# Patient Record
Sex: Female | Born: 1995 | Race: White | Hispanic: No | Marital: Married
Health system: Midwestern US, Community
[De-identification: ages and names within clinical notes are randomized; demographics above are authoritative.]

## PROBLEM LIST (undated history)

## (undated) DIAGNOSIS — E782 Mixed hyperlipidemia: Secondary | ICD-10-CM

## (undated) DIAGNOSIS — F419 Anxiety disorder, unspecified: Secondary | ICD-10-CM

## (undated) DIAGNOSIS — F329 Major depressive disorder, single episode, unspecified: Secondary | ICD-10-CM

## (undated) DIAGNOSIS — F32A Depression, unspecified: Secondary | ICD-10-CM

## (undated) HISTORY — DX: Major depressive disorder, single episode, unspecified: F32.9

## (undated) HISTORY — DX: Anxiety disorder, unspecified: F41.9

## (undated) HISTORY — DX: Depression, unspecified: F32.A

---

## 2017-08-05 ENCOUNTER — Encounter: Payer: Self-pay | Admitting: Osteopathic Medicine

## 2017-08-05 ENCOUNTER — Ambulatory Visit (INDEPENDENT_AMBULATORY_CARE_PROVIDER_SITE_OTHER): Payer: Managed Care, Other (non HMO) | Admitting: Osteopathic Medicine

## 2017-08-05 VITALS — BP 107/68 | HR 67 | Temp 98.3°F | Resp 16 | Wt 110.5 lb

## 2017-08-05 DIAGNOSIS — Z975 Presence of (intrauterine) contraceptive device: Secondary | ICD-10-CM | POA: Diagnosis not present

## 2017-08-05 DIAGNOSIS — Z8659 Personal history of other mental and behavioral disorders: Secondary | ICD-10-CM | POA: Diagnosis not present

## 2017-08-05 MED ORDER — LEVONORGESTREL 20 MCG/24HR IU IUD: 1.0000 | INTRAUTERINE_SYSTEM | Freq: Once | INTRAUTERINE | 0 refills | Status: DC

## 2017-08-05 MED ORDER — FLUOXETINE HCL 10 MG PO CAPS: 10.0000 mg | ORAL_CAPSULE | Freq: Every day | ORAL | 3 refills | Status: DC

## 2017-08-05 NOTE — Progress Notes (Signed)
HPI: Madison Carpenter is a 21 y.o. female who  has a past medical history of Anxiety and Depression.  she presents to [facility] today, 08/05/17,  for chief complaint of:  Chief Complaint  Patient presents with  . New Patient (Initial Visit)    Establish care    A pleasant new patient here to establish care. Her only real issue is that she needs a refill on her Prozac. She states that she has been well on this medication for several years, taking a low dose. She would like to get off of this at some point in the next couple of years as she plans on possibly trying to get pregnant in the next few years. Currently has IUD in place   Past medical, surgical, social and family history reviewed:  There are no active problems to display for this patient.  No past surgical history on file.  Social History   Tobacco Use  . Smoking status: Never Smoker  . Smokeless tobacco: Never Used  Substance Use Topics  . Alcohol use: Yes    Comment: socially    No family history on file.   Current medication list and allergy/intolerance information reviewed:    Current Outpatient Medications  Medication Sig Dispense Refill  . FLUoxetine (PROZAC) 10 MG capsule Take 1 capsule (10 mg total) daily by mouth. 90 capsule 3  . levonorgestrel (MIRENA) 20 MCG/24HR IUD 1 Intra Uterine Device (1 each total) once for 1 dose by Intrauterine route. Placed 2014 approx 1 each 0   No current facility-administered medications for this visit.     No Known Allergies    Review of Systems:  Constitutional:  No  fever, no chills, No recent illness, No unintentional weight changes. No significant fatigue.   HEENT: No  headache, no vision change, no hearing change, No sore throat, No  sinus pressure  Cardiac: No  chest pain, No  pressure, No palpitations, No  Orthopnea  Respiratory:  No  shortness of breath. No  Cough  Gastrointestinal: No  abdominal pain, No  nausea, No   vomiting,  No  blood in stool, No  diarrhea, No  constipation   Musculoskeletal: No new myalgia/arthralgia  Genitourinary: No  incontinence, No  abnormal genital bleeding, No abnormal genital discharge  Skin: No  Rash, No other wounds/concerning lesions  Hem/Onc: No  easy bruising/bleeding, No  abnormal lymph node  Endocrine: No cold intolerance,  No heat intolerance. No polyuria/polydipsia/polyphagia   Neurologic: No  weakness, No  dizziness, No  slurred speech/focal weakness/facial droop  Psychiatric: No  concerns with depression, No  concerns with anxiety, No sleep problems, No mood problems  Exam:  BP 107/68 (BP Location: Left Arm, Patient Position: Sitting, Cuff Size: Small)   Pulse 67   Temp 98.3 F (36.8 C) (Oral)   Resp 16   Wt 110 lb 8 oz (50.1 kg)   Constitutional: VS see above. General Appearance: alert, well-developed, well-nourished, NAD  Eyes: Normal lids and conjunctive, non-icteric sclera  Ears, Nose, Mouth, Throat: MMM, Normal external inspection ears/nares/mouth/lips/gums.   Neck: No masses, trachea midline. No thyroid enlargement. No tenderness/mass appreciated. No lymphadenopathy  Respiratory: Normal respiratory effort. no wheeze, no rhonchi, no rales  Cardiovascular: S1/S2 normal, no murmur, no rub/gallop auscultated. RRR. No lower extremity edema.   Gastrointestinal: Nontender, no masses. No hepatomegaly, no splenomegaly. No hernia appreciated. Bowel sounds normal. Rectal exam deferred.   Musculoskeletal: Gait normal. No clubbing/cyanosis of digits.   Neurological:  Normal balance/coordination. No tremor.   Skin: warm, dry, intact. No rash/ulcer.   Psychiatric: Normal judgment/insight. Normal mood and affect. Oriented x3.   Depression screen PHQ 2/9 08/05/2017  Decreased Interest 0  Down, Depressed, Hopeless 0  PHQ - 2 Score 0    ASSESSMENT/PLAN:    History of depression - Plan: FLUoxetine (PROZAC) 10 MG capsule  IUD (intrauterine device)  in place - Plan: levonorgestrel (MIRENA) 20 MCG/24HR IUD      Visit summary with medication list and pertinent instructions was printed for patient to review. All questions at time of visit were answered - patient instructed to contact office with any additional concerns. ER/RTC precautions were reviewed with the patient. Follow-up plan: Return for annual physical when due, sooner if needed .  Note: Total time spent 30 minutes, greater than 50% of the visit was spent face-to-face counseling and coordinating care for the following: The primary encounter diagnosis was History of depression. A diagnosis of IUD (intrauterine device) in place was also pertinent to this visit.Marland Kitchen  Please note: voice recognition software was used to produce this document, and typos may escape review. Please contact Dr. Sheppard Coil for any needed clarifications.

## 2017-08-14 ENCOUNTER — Encounter: Payer: Self-pay | Admitting: Osteopathic Medicine

## 2017-08-14 DIAGNOSIS — Z87898 Personal history of other specified conditions: Secondary | ICD-10-CM | POA: Insufficient documentation

## 2018-04-16 ENCOUNTER — Ambulatory Visit (INDEPENDENT_AMBULATORY_CARE_PROVIDER_SITE_OTHER): Payer: Managed Care, Other (non HMO) | Admitting: Osteopathic Medicine

## 2018-04-16 ENCOUNTER — Encounter: Payer: Self-pay | Admitting: Osteopathic Medicine

## 2018-04-16 ENCOUNTER — Other Ambulatory Visit (HOSPITAL_COMMUNITY)
Admission: RE | Admit: 2018-04-16 | Discharge: 2018-04-16 | Disposition: A | Discharge status: Home / Self Care | Payer: Managed Care, Other (non HMO) | Source: Ambulatory Visit | Attending: Family Medicine | Admitting: Family Medicine

## 2018-04-16 VITALS — BP 119/63 | HR 58 | Temp 98.4°F | Wt 107.9 lb

## 2018-04-16 DIAGNOSIS — Z124 Encounter for screening for malignant neoplasm of cervix: Secondary | ICD-10-CM | POA: Diagnosis not present

## 2018-04-16 DIAGNOSIS — Z111 Encounter for screening for respiratory tuberculosis: Secondary | ICD-10-CM | POA: Diagnosis not present

## 2018-04-16 DIAGNOSIS — Z0000 Encounter for general adult medical examination without abnormal findings: Secondary | ICD-10-CM | POA: Diagnosis present

## 2018-04-16 DIAGNOSIS — Z23 Encounter for immunization: Secondary | ICD-10-CM | POA: Diagnosis not present

## 2018-04-16 DIAGNOSIS — Z975 Presence of (intrauterine) contraceptive device: Secondary | ICD-10-CM

## 2018-04-16 LAB — HM PAP SMEAR: PAP Smear, External: NEGATIVE

## 2018-04-16 MED ORDER — LEVONORGESTREL 20 MCG/24HR IU IUD: 1.0000 | INTRAUTERINE_SYSTEM | Freq: Once | INTRAUTERINE | 0 refills | Status: DC

## 2018-04-16 NOTE — Patient Instructions (Addendum)
General Preventive Care  Most recent routine screening lipids/other labs: ordered today  Tobacco: don'! Alcohol: moderation is ok for most people. Recreational/Illicit Drugs: don't!  Exercise: as tolerated to reduce risk of cardiovascular disease and diabetes  Mental health: if need for mental health care (medicines, counseling, other), or concerns about moods, please let me know!   Sexual health: if need for pregnancy prevention or STD testing, or if concern for pain/abnormal bleeding, libido issues, intimate partner violence, please let me know!   Vaccines  Flu vaccine: recommended every fall (by Halloween!)  HPV vaccine: recommended for teenagers and young adults  Tetanus booster: Tdap recommended every 10 years  Cancer screenings   Colon cancer screening: recommended at age 31, colonoscopy sooner if risk factors   Breast cancer screening: mammogram recommended at age 70, mammogram and MRI sooner if risk factors   Cervical cancer screening: Pap every 1 to 5 years depending on age and other risk factors.   Infection screenings . HIV: recommended screening at least once age 76-65, more often if risk factors  . Gonorrhea/Chlamydia: many insurances require testing for anyone on birth control pills, otherwise screening as needed . TB: if required by employer, or if other risk factors are present  Other . Bone Density Test: recommended for women at age 42 . Advanced Directive: Living Will and/or Healthcare Power of Attorney recommended for everyone, regardless of age or health . Cholesterol & Diabetes: recommended screening periodically . Thyroid and Vitamin D: routine screening not recommended, most insurance will not cover this test - please check with the lab prior to your blood draw if you are concerned about cost of this testing

## 2018-04-16 NOTE — Progress Notes (Signed)
HPI: Madison Carpenter is a 22 y.o. female who  has a past medical history of Anxiety and Depression.  she presents to [facility] today, 04/16/18,  for chief complaint of: Annual Physical Pap  Patient here for annual physical / wellness exam.  See preventive care reviewed as below.   Additional concerns today include:  Needs TB test for work     Past medical, surgical, social and family history reviewed:  Patient Active Problem List   Diagnosis Date Noted  . History of syncope 08/14/2017  . History of depression 08/05/2017  . IUD (intrauterine device) in place 08/05/2017    No past surgical history on file.  Social History   Tobacco Use  . Smoking status: Never Smoker  . Smokeless tobacco: Never Used  Substance Use Topics  . Alcohol use: Yes    Comment: socially    No family history on file.   Current medication list and allergy/intolerance information reviewed:    Current Outpatient Medications  Medication Sig Dispense Refill  . FLUoxetine (PROZAC) 10 MG capsule Take 1 capsule (10 mg total) daily by mouth. 90 capsule 3  . levonorgestrel (MIRENA) 20 MCG/24HR IUD 1 Intra Uterine Device (1 each total) once for 1 dose by Intrauterine route. Placed 2014 approx 1 each 0   No current facility-administered medications for this visit.     No Known Allergies    Review of Systems:  Constitutional:  No  fever, no chills, No recent illness, No unintentional weight changes. No significant fatigue.   HEENT: No  headache, no vision change, no hearing change, No sore throat, No  sinus pressure  Cardiac: No  chest pain, No  pressure, No palpitations, No  Orthopnea  Respiratory:  No  shortness of breath. No  Cough  Gastrointestinal: No  abdominal pain, No  nausea, No  vomiting,  No  blood in stool, No  diarrhea, No  constipation   Musculoskeletal: No new myalgia/arthralgia  Skin: No  Rash, No other wounds/concerning  lesions  Genitourinary: No  incontinence, No  abnormal genital bleeding, No abnormal genital discharge  Hem/Onc: No  easy bruising/bleeding, No  abnormal lymph node  Endocrine: No cold intolerance,  No heat intolerance. No polyuria/polydipsia/polyphagia   Neurologic: No  weakness, No  dizziness, No  slurred speech/focal weakness/facial droop  Psychiatric: No  concerns with depression, No  concerns with anxiety, No sleep problems, No mood problems  Exam:  BP 119/63 (BP Location: Left Arm, Patient Position: Sitting, Cuff Size: Normal)   Pulse (!) 58   Temp 98.4 F (36.9 C) (Oral)   Wt 107 lb 14.4 oz (48.9 kg)   Constitutional: VS see above. General Appearance: alert, well-developed, well-nourished, NAD  Eyes: Normal lids and conjunctive, non-icteric sclera  Ears, Nose, Mouth, Throat: MMM, Normal external inspection ears/nares/mouth/lips/gums. TM normal bilaterally. Pharynx/tonsils no erythema, no exudate. Nasal mucosa normal.   Neck: No masses, trachea midline. No thyroid enlargement. No tenderness/mass appreciated. No lymphadenopathy  Respiratory: Normal respiratory effort. no wheeze, no rhonchi, no rales  Cardiovascular: S1/S2 normal, no murmur, no rub/gallop auscultated. RRR. No lower extremity edema. Pedal pulse II/IV bilaterally DP and PT. No carotid bruit or JVD. No abdominal aortic bruit.  Gastrointestinal: Nontender, no masses. No hepatomegaly, no splenomegaly. No hernia appreciated. Bowel sounds normal. Rectal exam deferred.   Musculoskeletal: Gait normal. No clubbing/cyanosis of digits.   Neurological: Normal balance/coordination. No tremor. No cranial nerve deficit on limited exam. Motor and sensation intact and symmetric. Cerebellar  reflexes intact.   Skin: warm, dry, intact. No rash/ulcer. No concerning nevi or subq nodules on limited exam.    Psychiatric: Normal judgment/insight. Normal mood and affect. Oriented x3.  GYN: No lesions/ulcers to external genitalia,  normal urethra, normal vaginal mucosa, physiologic discharge, cervix normal without lesions, uterus not enlarged or tender, adnexa no masses and nontender  BREAST: No rashes/skin changes, normal fibrous breast tissue, small mobile cystic lesion palpable at R breast 10-11:00 position near axilla, normal nipple without discharge, normal axilla   Immunization History  Administered Date(s) Administered  . PPD Test 04/16/2018  . Tdap 04/16/2018    Has had HPV vaccine  ASSESSMENT/PLAN:    Annual physical exam - Plan: CBC, COMPLETE METABOLIC PANEL WITH GFR, Lipid panel, VITAMIN D 25 Hydroxy (Vit-D Deficiency, Fractures), TSH, Cytology - PAP  Cervical cancer screening - Plan: Cytology - PAP  Need for Tdap vaccination - Plan: Tdap vaccine greater than or equal to 7yo IM  Screening for tuberculosis - Plan: PPD  IUD (intrauterine device) in place - Plan: levonorgestrel (MIRENA) 20 MCG/24HR IUD    Patient Instructions  General Preventive Care  Most recent routine screening lipids/other labs: ordered today  Tobacco: don'! Alcohol: moderation is ok for most people. Recreational/Illicit Drugs: don't!  Exercise: as tolerated to reduce risk of cardiovascular disease and diabetes  Mental health: if need for mental health care (medicines, counseling, other), or concerns about moods, please let me know!   Sexual health: if need for pregnancy prevention or STD testing, or if concern for pain/abnormal bleeding, libido issues, intimate partner violence, please let me know!   Vaccines  Flu vaccine: recommended every fall (by Halloween!)  HPV vaccine: recommended for teenagers and young adults  Tetanus booster: Tdap recommended every 10 years  Cancer screenings   Colon cancer screening: recommended at age 58, colonoscopy sooner if risk factors   Breast cancer screening: mammogram recommended at age 67, mammogram and MRI sooner if risk factors   Cervical cancer screening: Pap every 1 to 5  years depending on age and other risk factors.   Infection screenings . HIV: recommended screening at least once age 54-65, more often if risk factors  . Gonorrhea/Chlamydia: many insurances require testing for anyone on birth control pills, otherwise screening as needed . TB: if required by employer, or if other risk factors are present  Other . Bone Density Test: recommended for women at age 24 . Advanced Directive: Living Will and/or Healthcare Power of Attorney recommended for everyone, regardless of age or health . Cholesterol & Diabetes: recommended screening periodically . Thyroid and Vitamin D: routine screening not recommended, most insurance will not cover this test - please check with the lab prior to your blood draw if you are concerned about cost of this testing          Visit summary with medication list and pertinent instructions was printed for patient to review. All questions at time of visit were answered - patient instructed to contact office with any additional concerns. ER/RTC precautions were reviewed with the patient.   Follow-up plan: Return in about 1 year (around 04/17/2019) for annual check-up, remove Mirena and replace if desired . 2 days for TB read.    Please note: voice recognition software was used to produce this document, and typos may escape review. Please contact Dr. Sheppard Coil for any needed clarifications.

## 2018-04-18 ENCOUNTER — Encounter: Payer: Self-pay | Admitting: Osteopathic Medicine

## 2018-04-18 ENCOUNTER — Ambulatory Visit (INDEPENDENT_AMBULATORY_CARE_PROVIDER_SITE_OTHER): Payer: Managed Care, Other (non HMO) | Admitting: Physician Assistant

## 2018-04-18 VITALS — BP 115/65 | HR 75 | Wt 107.0 lb

## 2018-04-18 DIAGNOSIS — N6001 Solitary cyst of right breast: Secondary | ICD-10-CM

## 2018-04-18 DIAGNOSIS — N6002 Solitary cyst of left breast: Secondary | ICD-10-CM

## 2018-04-18 DIAGNOSIS — Z111 Encounter for screening for respiratory tuberculosis: Secondary | ICD-10-CM

## 2018-04-18 LAB — CBC
HCT: 39.9 % (ref 35.0–45.0)
Hemoglobin: 14 g/dL (ref 11.7–15.5)
MCH: 31.3 pg (ref 27.0–33.0)
MCHC: 35.1 g/dL (ref 32.0–36.0)
MCV: 89.3 fL (ref 80.0–100.0)
MPV: 11.2 fL (ref 7.5–12.5)
Platelets: 214 10*3/uL (ref 140–400)
RBC: 4.47 10*6/uL (ref 3.80–5.10)
RDW: 11.8 % (ref 11.0–15.0)
WBC: 7.1 10*3/uL (ref 3.8–10.8)

## 2018-04-18 LAB — COMPLETE METABOLIC PANEL WITH GFR
AG Ratio: 2.1 (calc) (ref 1.0–2.5)
ALBUMIN MSPROF: 4.5 g/dL (ref 3.6–5.1)
ALKALINE PHOSPHATASE (APISO): 45 U/L (ref 33–115)
ALT: 13 U/L (ref 6–29)
AST: 19 U/L (ref 10–30)
BILIRUBIN TOTAL: 0.6 mg/dL (ref 0.2–1.2)
BUN: 9 mg/dL (ref 7–25)
CHLORIDE: 103 mmol/L (ref 98–110)
CO2: 28 mmol/L (ref 20–32)
Calcium: 9.6 mg/dL (ref 8.6–10.2)
Creat: 0.8 mg/dL (ref 0.50–1.10)
GFR, Est African American: 120 mL/min/{1.73_m2} (ref >=60)
GFR, Est Non African American: 104 mL/min/{1.73_m2} (ref >=60)
GLOBULIN: 2.1 g/dL (ref 1.9–3.7)
GLUCOSE: 82 mg/dL (ref 65–139)
Potassium: 3.7 mmol/L (ref 3.5–5.3)
Sodium: 139 mmol/L (ref 135–146)
Total Protein: 6.6 g/dL (ref 6.1–8.1)

## 2018-04-18 LAB — LIPID PANEL
CHOLESTEROL: 183 mg/dL (ref <200)
HDL: 62 mg/dL (ref >50)
LDL Cholesterol (Calc): 102 mg/dL (calc) — ABNORMAL HIGH
Non-HDL Cholesterol (Calc): 121 mg/dL (calc) (ref <130)
Total CHOL/HDL Ratio: 3 (calc) (ref <5.0)
Triglycerides: 96 mg/dL (ref <150)

## 2018-04-18 LAB — CYTOLOGY - PAP: DIAGNOSIS: NEGATIVE

## 2018-04-18 LAB — TB SKIN TEST
Induration: 0 mm
TB Skin Test: NEGATIVE

## 2018-04-28 ENCOUNTER — Other Ambulatory Visit: Payer: Self-pay | Admitting: Osteopathic Medicine

## 2018-04-28 ENCOUNTER — Ambulatory Visit
Admission: RE | Admit: 2018-04-28 | Discharge: 2018-04-28 | Disposition: A | Discharge status: Home / Self Care | Payer: Managed Care, Other (non HMO) | Source: Ambulatory Visit | Attending: Osteopathic Medicine | Admitting: Osteopathic Medicine

## 2018-04-28 DIAGNOSIS — N6310 Unspecified lump in the right breast, unspecified quadrant: Secondary | ICD-10-CM

## 2018-08-11 ENCOUNTER — Encounter: Payer: Self-pay | Admitting: Osteopathic Medicine

## 2018-08-11 DIAGNOSIS — Z8659 Personal history of other mental and behavioral disorders: Secondary | ICD-10-CM

## 2018-08-12 MED ORDER — FLUOXETINE HCL 10 MG PO CAPS: 10.0000 mg | ORAL_CAPSULE | Freq: Every day | ORAL | 3 refills | Status: DC

## 2018-08-12 NOTE — Addendum Note (Signed)
Addended by: Alena Bills R on: 08/12/2018 09:24 AM   Modules accepted: Orders

## 2018-09-18 ENCOUNTER — Encounter: Payer: Self-pay | Admitting: Osteopathic Medicine

## 2018-09-19 ENCOUNTER — Telehealth: Payer: Self-pay | Admitting: Osteopathic Medicine

## 2018-09-19 DIAGNOSIS — Z113 Encounter for screening for infections with a predominantly sexual mode of transmission: Secondary | ICD-10-CM

## 2018-09-19 NOTE — Telephone Encounter (Signed)
GC/CT screen prior to IUD

## 2018-10-31 ENCOUNTER — Ambulatory Visit
Admission: RE | Admit: 2018-10-31 | Discharge: 2018-10-31 | Disposition: A | Discharge status: Home / Self Care | Payer: Managed Care, Other (non HMO) | Source: Ambulatory Visit | Attending: Osteopathic Medicine | Admitting: Osteopathic Medicine

## 2018-10-31 ENCOUNTER — Other Ambulatory Visit: Payer: Self-pay | Admitting: Osteopathic Medicine

## 2018-10-31 DIAGNOSIS — N6310 Unspecified lump in the right breast, unspecified quadrant: Secondary | ICD-10-CM

## 2018-11-11 ENCOUNTER — Encounter: Payer: Self-pay | Admitting: Osteopathic Medicine

## 2018-11-14 ENCOUNTER — Telehealth: Payer: Self-pay | Admitting: Osteopathic Medicine

## 2018-11-14 ENCOUNTER — Ambulatory Visit (INDEPENDENT_AMBULATORY_CARE_PROVIDER_SITE_OTHER): Payer: Managed Care, Other (non HMO) | Admitting: Osteopathic Medicine

## 2018-11-14 ENCOUNTER — Ambulatory Visit: Payer: Managed Care, Other (non HMO) | Admitting: Osteopathic Medicine

## 2018-11-14 ENCOUNTER — Encounter: Payer: Self-pay | Admitting: Osteopathic Medicine

## 2018-11-14 VITALS — BP 125/77 | HR 72 | Temp 97.9°F | Wt 109.5 lb

## 2018-11-14 DIAGNOSIS — J019 Acute sinusitis, unspecified: Secondary | ICD-10-CM

## 2018-11-14 MED ORDER — AMOXICILLIN-POT CLAVULANATE 875-125 MG PO TABS: 1.0000 | ORAL_TABLET | Freq: Two times a day (BID) | ORAL | 0 refills | Status: DC

## 2018-11-14 NOTE — Telephone Encounter (Signed)
Patient called stating that her GPS said she was going to be running late. Moved appointment to a later time. One would be a no show, the other she is coming to.

## 2018-11-14 NOTE — Patient Instructions (Signed)
Medications & Home Remedies for Upper Respiratory Illness   Note: the following list assumes no pregnancy, normal liver & kidney function and no other drug interactions. Dr. Sheppard Coil has highlighted medications which are safe for you to use, but these may not be appropriate for everyone. Always ask a pharmacist or qualified medical provider if you have any questions!    Aches/Pains, Fever, Headache OTC Acetaminophen (Tylenol) 500 mg tablets - take max 2 tablets (1000 mg) every 6 hours (4 times per day)  OTC Ibuprofen (Motrin) 200 mg tablets - take max 4 tablets (800 mg) every 6 hours   Sinus Congestion OTC Nasal Saline if desired to rinse OTC Oxymetolazone (Afrin, others) sparing use due to rebound congestion, NEVER use in kids OTC Phenylephrine (Sudafed) 10 mg tablets every 4 hours (or the 12-hour formulation) OTC Diphenhydramine (Benadryl) 25 mg tablets - take max 2 tablets every 4 hours   Cough & Sore Throat OTC Dextromethorphan (Robitussin, others) - cough suppressant OTC Guaifenesin (Robitussin, Mucinex, others) - expectorant (helps cough up mucus) (Dextromethorphan and Guaifenesin also come in a combination tablet/syrup) OTC Lozenges w/ Benzocaine + Menthol (Cepacol) Honey - as much as you want! Teas which "coat the throat" - look for ingredients Elm Bark, Licorice Root, Marshmallow Root   Other Prescription Antibiotics if these are necessary for bacterial infection - take ALL, even if you're feeling better  OTC Zinc Lozenges within 24 hours of symptoms onset - mixed evidence this shortens the duration of the common cold Don't waste your money on Vitamin C or Echinacea in acute illness - it's already too late!

## 2018-11-14 NOTE — Progress Notes (Signed)
HPI: Madison Carpenter is a 23 y.o. female who  has a past medical history of Anxiety and Depression.  she presents to [facility] today, 11/14/18,  for chief complaint of: Sick - Sinus problem, new  . Location/Quality: sinus congestion, headaches . Duration: 10 days . Modifying factors: Tylenol helps, last dose yesterday  . Assoc signs/symptoms: fever on and off    Past medical, surgical, social and family history reviewed and updated as necessary.   Current medication list and allergy/intolerance information reviewed:    Current Outpatient Medications  Medication Sig Dispense Refill  . FLUoxetine (PROZAC) 10 MG capsule Take 1 capsule (10 mg total) by mouth daily. 90 capsule 3  . amoxicillin-clavulanate (AUGMENTIN) 875-125 MG tablet Take 1 tablet by mouth 2 (two) times daily. 14 tablet 0  . levonorgestrel (MIRENA) 20 MCG/24HR IUD 1 Intra Uterine Device (1 each total) by Intrauterine route once for 1 dose. Placed 03/2014 1 each 0   No current facility-administered medications for this visit.     No Known Allergies    Review of Systems:  Constitutional:  +fever, no chills, +recent illness, No unintentional weight changes. +significant fatigue.   HEENT: +headache, no vision change, no hearing change, No sore throat, +sinus pressure  Cardiac: No  chest pain, No  pressure, No palpitations  Respiratory:  No  shortness of breath. No  Cough  Gastrointestinal: No  abdominal pain, No  nausea, No  vomiting,  No  blood in stool, No  diarrhea  Musculoskeletal: No new myalgia/arthralgia  Skin: No  Rash  Neurologic: No  weakness, No  dizziness  Exam:  BP 125/77 (BP Location: Left Arm, Patient Position: Sitting, Cuff Size: Small)   Pulse 72   Temp 97.9 F (36.6 C) (Oral)   Wt 109 lb 8 oz (49.7 kg)   SpO2 100%   Constitutional: VS see above. General Appearance: alert, well-developed, well-nourished, NAD  Eyes: Normal lids and conjunctive,  non-icteric sclera  Ears, Nose, Mouth, Throat: MMM, Normal external inspection ears/nares/mouth/lips/gums. TM normal bilaterally. Pharynx/tonsils +erythema, no exudate. Nasal mucosa normal.   Neck: No masses, trachea midline. No tenderness/mass appreciated. No lymphadenopathy  Respiratory: Normal respiratory effort. no wheeze, no rhonchi, no rales  Cardiovascular: S1/S2 normal, no murmur, no rub/gallop auscultated. RRR. No lower extremity edema.   Musculoskeletal: Gait normal.   Neurological: Normal balance/coordination. No tremor.   Skin: warm, dry, intact.   Psychiatric: Normal judgment/insight. Normal mood and affect.       ASSESSMENT/PLAN: The encounter diagnosis was Acute non-recurrent sinusitis, unspecified location.   Meds ordered this encounter  Medications  . amoxicillin-clavulanate (AUGMENTIN) 875-125 MG tablet    Sig: Take 1 tablet by mouth 2 (two) times daily.    Dispense:  14 tablet    Refill:  0    Patient Instructions  Medications & Home Remedies for Upper Respiratory Illness   Note: the following list assumes no pregnancy, normal liver & kidney function and no other drug interactions. Dr. Sheppard Coil has highlighted medications which are safe for you to use, but these may not be appropriate for everyone. Always ask a pharmacist or qualified medical provider if you have any questions!    Aches/Pains, Fever, Headache OTC Acetaminophen (Tylenol) 500 mg tablets - take max 2 tablets (1000 mg) every 6 hours (4 times per day)  OTC Ibuprofen (Motrin) 200 mg tablets - take max 4 tablets (800 mg) every 6 hours   Sinus Congestion OTC Nasal Saline if desired to rinse  OTC Oxymetolazone (Afrin, others) sparing use due to rebound congestion, NEVER use in kids OTC Phenylephrine (Sudafed) 10 mg tablets every 4 hours (or the 12-hour formulation) OTC Diphenhydramine (Benadryl) 25 mg tablets - take max 2 tablets every 4 hours   Cough & Sore Throat OTC Dextromethorphan  (Robitussin, others) - cough suppressant OTC Guaifenesin (Robitussin, Mucinex, others) - expectorant (helps cough up mucus) (Dextromethorphan and Guaifenesin also come in a combination tablet/syrup) OTC Lozenges w/ Benzocaine + Menthol (Cepacol) Honey - as much as you want! Teas which "coat the throat" - look for ingredients Elm Bark, Licorice Root, Marshmallow Root   Other Prescription Antibiotics if these are necessary for bacterial infection - take ALL, even if you're feeling better  OTC Zinc Lozenges within 24 hours of symptoms onset - mixed evidence this shortens the duration of the common cold Don't waste your money on Vitamin C or Echinacea in acute illness - it's already too late!        Visit summary with medication list and pertinent instructions was printed for patient to review. All questions at time of visit were answered - patient instructed to contact office with any additional concerns or updates. ER/RTC precautions were reviewed with the patient.   Follow-up plan: Return if symptoms worsen or fail to improve. Annual physical due 03/2019.    Please note: voice recognition software was used to produce this document, and typos may escape review. Please contact Dr. Sheppard Coil for any needed clarifications.

## 2019-02-18 ENCOUNTER — Telehealth: Payer: Self-pay | Admitting: Osteopathic Medicine

## 2019-02-18 DIAGNOSIS — Z113 Encounter for screening for infections with a predominantly sexual mode of transmission: Secondary | ICD-10-CM

## 2019-02-18 NOTE — Telephone Encounter (Signed)
Pt called. She has an appointment on 6/9 to get a new IUD(she is currently has the  Mirena -5 yrs).  She is going to the lab this Friday to have STD test. Thanks

## 2019-02-19 NOTE — Telephone Encounter (Signed)
Orders placed.

## 2019-02-20 NOTE — Telephone Encounter (Signed)
Pt has been updated. She will stop by today to complete lab order. No other inquiries during call.

## 2019-02-20 NOTE — Telephone Encounter (Signed)
Thank you :)

## 2019-02-23 LAB — C. TRACHOMATIS/N. GONORRHOEAE RNA
C. trachomatis RNA, TMA: NOT DETECTED
N. gonorrhoeae RNA, TMA: NOT DETECTED

## 2019-02-24 ENCOUNTER — Ambulatory Visit (INDEPENDENT_AMBULATORY_CARE_PROVIDER_SITE_OTHER): Payer: Managed Care, Other (non HMO) | Admitting: Osteopathic Medicine

## 2019-02-24 ENCOUNTER — Encounter: Payer: Self-pay | Admitting: Osteopathic Medicine

## 2019-02-24 VITALS — BP 129/72 | HR 73 | Temp 98.5°F | Wt 110.0 lb

## 2019-02-24 DIAGNOSIS — Z30433 Encounter for removal and reinsertion of intrauterine contraceptive device: Secondary | ICD-10-CM | POA: Diagnosis not present

## 2019-02-24 DIAGNOSIS — Z975 Presence of (intrauterine) contraceptive device: Secondary | ICD-10-CM

## 2019-02-24 MED ORDER — LEVONORGESTREL 20 MCG/24HR IU IUD: 1.0000 | INTRAUTERINE_SYSTEM | Freq: Once | INTRAUTERINE | 0 refills | Status: AC

## 2019-02-24 NOTE — Progress Notes (Signed)
HPI: Madison Carpenter is a 23 y.o. female who  has a past medical history of Anxiety and Depression.  she presents to [facility] today, 02/25/19,  for chief complaint of:  IUD removal and reinsertion: Mirena   Here for routine IUD removal and reinsertion of Mirena.   At today's visit 02/25/19 ... PMH, PSH, FH reviewed and updated as needed.  Current medication list and allergy/intolerance hx reviewed and updated as needed. (See remainder of HPI, ROS, Phys Exam below)          ASSESSMENT/PLAN: The primary encounter diagnosis was Encounter for removal and reinsertion of intrauterine contraceptive device (IUD). A diagnosis of IUD (intrauterine device) in place was also pertinent to this visit.     Meds ordered this encounter  Medications  . levonorgestrel (MIRENA) 20 MCG/24HR IUD    Sig: 1 Intra Uterine Device (1 each total) by Intrauterine route once for 1 dose. Placed 02/2019    Dispense:  1 each    Refill:  0    Patient Instructions  IUD AFTER-CARE INSTRUCTIONS: READ THOROUGHLY  Your Mirena IUD is currently approved to remain in place for 5 years. At that time, if you wish to receive a new IUD, this can be placed when your current one is removed. If you wish to remove your IUD at any time, for any reason, this can be done easily by your doctor.   You should feel for the strings to your IUD routinely. If you cannot locate the strings, it is recommended you alert your doctor of this.   Be aware that in the first few weeks, your new IUD may cause some discomfort/cramping as it settles into place in your uterus. To ease discomfort, you may apply heating pad to abdomen and take Ibuprofen 800mg  by mouth every 6 hours as needed, but avoid using this dose continuously for more than 5 days. Walking helps as well. You can also expect some irregular bleeding but it should not be heavy for more than a few days.   If pain is severe or if severe  bleeding occurs, or if foul-smelling discharge or fever develops, or if you have any other concerns - contact your doctor right away or go to the Emergency Room.  IUD's are a very reliable method of birth control, but no method is 100% effective. If you think you may be pregnant, see your doctor right away.   An IUD will not protect you from sexually transmitted infections such as HIV, gonorrhea, chlamydia, HPV and others.   It is recommended that you see your doctor as directed for routine well-woman care, which includes Pap testing and may include screening for infections.        Follow-up plan: Return in about 4 weeks (around 03/24/2019) for IUD string check, sooner if needed .                                                 ################################################# ################################################# ################################################# #################################################    Current Meds  Medication Sig  . FLUoxetine (PROZAC) 10 MG capsule Take 1 capsule (10 mg total) by mouth daily.    No Known Allergies     Review of Systems:  Constitutional: No recent illness  HEENT: No  headache  Cardiac: No  chest pain, No  pressure, No palpitations  Respiratory:  No  shortness of breath. No  Cough  Neurologic: No  weakness, No  Dizziness   Exam:  BP 129/72 (BP Location: Left Arm, Patient Position: Sitting, Cuff Size: Normal)   Pulse 73   Temp 98.5 F (36.9 C) (Oral)   Wt 110 lb (49.9 kg)   Constitutional: VS see above. General Appearance: alert, well-developed, well-nourished, NAD  Neck: No masses, trachea midline.   Respiratory: Normal respiratory effort.  Musculoskeletal: Gait normal.   Neurological: Normal balance/coordination. No tremor.  Skin: warm, dry, intact.   Psychiatric: Normal judgment/insight. Normal mood and affect. Oriented x3.  GYN: No lesions/ulcers  to external genitalia, normal urethra, normal vaginal mucosa, physiologic discharge, cervix normal without lesions, uterus not enlarged or tender, adnexa no masses and nontender   IUD PROCEDURE NOTE  PERTINENT RESULTS REVIEWED: PREGNANCY TEST PRIOR TO PROCEDURE: Negative GONORRHEA/CHLAMYDIA SCREEN: Negative  PRIOR TO PROCEDURE: INFORMED CONSENT OBTAINED: yes SEE SCANNED DOCUMENTS ANY PRETREATMENT: no  PHYSICAL EXAM: GYN: No lesions/ulcers to external genitalia, normal urethra, normal vaginal mucosa, physiologic discharge, cervix normal without lesions, uterus not enlarged or tender, adnexa no masses and nontender  DESCRIPTION OF PROCEDURE: Vaginal speculum placed. Cervix and proximal vagina cleaned with Betadine.Tenaculum applied at 12:00 cervical position and gentle traction applied. Uterus sounded to 7 cm. IUD placed without difficulty. IUD threads cut to 2-3cm from cervical os. Tenaculum and speculum removed. Patient felt strings. Patient tolerated procedure well. Sterile technique maintained.   IUD INFORMATION: BRAND: Mirena LOT NUMBER: [id number] CARD GIVEN TO PATIENT: yes      Visit summary with medication list and pertinent instructions was printed for patient to review, patient was advised to alert [place] if any updates are needed. All questions at time of visit were answered - patient instructed to contact office with any additional concerns. ER/RTC precautions were reviewed with the patient and understanding verbalized.     Please note: voice recognition software was used to produce this document, and typos may escape review. Please contact Dr. Sheppard Coil for any needed clarifications.    Follow up plan: Return in about 4 weeks (around 03/24/2019) for IUD string check, sooner if needed .

## 2019-02-24 NOTE — Patient Instructions (Signed)
IUD AFTER-CARE INSTRUCTIONS: READ THOROUGHLY  Your Mirena IUD is currently approved to remain in place for 5 years. At that time, if you wish to receive a new IUD, this can be placed when your current one is removed. If you wish to remove your IUD at any time, for any reason, this can be done easily by your doctor.   You should feel for the strings to your IUD routinely. If you cannot locate the strings, it is recommended you alert your doctor of this.   Be aware that in the first few weeks, your new IUD may cause some discomfort/cramping as it settles into place in your uterus. To ease discomfort, you may apply heating pad to abdomen and take Ibuprofen 800mg by mouth every 6 hours as needed, but avoid using this dose continuously for more than 5 days. Walking helps as well. You can also expect some irregular bleeding but it should not be heavy for more than a few days.   If pain is severe or if severe bleeding occurs, or if foul-smelling discharge or fever develops, or if you have any other concerns - contact your doctor right away or go to the Emergency Room.  IUD's are a very reliable method of birth control, but no method is 100% effective. If you think you may be pregnant, see your doctor right away. An IUD will not protect you from sexually transmitted infections such as HIV, gonorrhea, chlamydia, HPV and others.   It is recommended that you see your doctor as directed for routine well-woman care, which includes Pap testing and may include screening for infections.   

## 2019-03-24 ENCOUNTER — Ambulatory Visit (INDEPENDENT_AMBULATORY_CARE_PROVIDER_SITE_OTHER): Payer: Managed Care, Other (non HMO) | Admitting: Osteopathic Medicine

## 2019-03-24 ENCOUNTER — Encounter: Payer: Self-pay | Admitting: Osteopathic Medicine

## 2019-03-24 VITALS — BP 104/62 | HR 63 | Temp 98.1°F | Wt 109.4 lb

## 2019-03-24 DIAGNOSIS — Z975 Presence of (intrauterine) contraceptive device: Secondary | ICD-10-CM

## 2019-03-24 NOTE — Progress Notes (Signed)
CC: IUD string checkup S: Mirena IUD inserted 02/24/2019, since then patient reports no problems, doing well, no complaints, does not desire strings to be trimmed  O: GYN: No lesions/ulcers to external genitalia, normal urethra, normal vaginal mucosa, physiologic discharge, cervix normal without lesions, IUD strings visible A/P: IUD monitoring. IUD strings visible, did not need to be trimmed

## 2019-04-08 ENCOUNTER — Telehealth: Payer: Self-pay | Admitting: Osteopathic Medicine

## 2019-04-08 NOTE — Telephone Encounter (Signed)
Patient would like a TB Test for work. Please advise so I can call patient back for appointment.

## 2019-04-08 NOTE — Telephone Encounter (Signed)
Does she know if work requires a PPD/skin test or if a blood test is okay?  If blood test needed, let me know and I can put in the lab order.  If the skin test is needed, she needs a nurse visit.

## 2019-04-14 ENCOUNTER — Other Ambulatory Visit: Payer: Self-pay

## 2019-04-14 ENCOUNTER — Ambulatory Visit: Payer: Managed Care, Other (non HMO) | Admitting: Osteopathic Medicine

## 2019-04-14 VITALS — BP 121/65 | HR 70 | Wt 108.0 lb

## 2019-04-14 DIAGNOSIS — Z111 Encounter for screening for respiratory tuberculosis: Secondary | ICD-10-CM | POA: Diagnosis not present

## 2019-04-14 NOTE — Progress Notes (Signed)
Established Patient Office Visit  Subjective:  Patient ID: Madison Carpenter, female    DOB: August 02, 1996  Age: 23 y.o. MRN: [id number]  CC:  Chief Complaint  Patient presents with  . PPD Placement    HPI Madison Carpenter presents for PPD placement for work.   Past Medical History:  Diagnosis Date  . Anxiety   . Depression     History reviewed. No pertinent surgical history.  Family History  Problem Relation Age of Onset  . Breast cancer Maternal Grandmother   . Colon cancer Neg Hx     Social History   Socioeconomic History  . Marital status: Single    Spouse name: Not on file  . Number of children: Not on file  . Years of education: Not on file  . Highest education level: Not on file  Occupational History  . Not on file  Social Needs  . Financial resource strain: Not on file  . Food insecurity    Worry: Not on file    Inability: Not on file  . Transportation needs    Medical: Not on file    Non-medical: Not on file  Tobacco Use  . Smoking status: Never Smoker  . Smokeless tobacco: Never Used  Substance and Sexual Activity  . Alcohol use: Yes    Comment: socially  . Drug use: No  . Sexual activity: Yes    Partners: Male    Birth control/protection: I.U.D.  Lifestyle  . Physical activity    Days per week: Not on file    Minutes per session: Not on file  . Stress: Not on file  Relationships  . Social Herbalist on phone: Not on file    Gets together: Not on file    Attends religious service: Not on file    Active member of club or organization: Not on file    Attends meetings of clubs or organizations: Not on file    Relationship status: Not on file  . Intimate partner violence    Fear of current or ex partner: Not on file    Emotionally abused: Not on file    Physically abused: Not on file    Forced sexual activity: Not on file  Other Topics Concern  . Not on file  Social History Narrative  . Not on file    Outpatient Medications  Prior to Visit  Medication Sig Dispense Refill  . FLUoxetine (PROZAC) 10 MG capsule Take 1 capsule (10 mg total) by mouth daily. 90 capsule 3  . levonorgestrel (MIRENA) 20 MCG/24HR IUD 1 Intra Uterine Device (1 each total) by Intrauterine route once for 1 dose. Placed 02/2019 1 each 0   No facility-administered medications prior to visit.     No Known Allergies  ROS Review of Systems    Objective:    Physical Exam  BP 121/65   Pulse 70   Wt 108 lb (49 kg)   SpO2 99%  Wt Readings from Last 3 Encounters:  04/14/19 108 lb (49 kg)  03/24/19 109 lb 6.4 oz (49.6 kg)  02/24/19 110 lb (49.9 kg)     Health Maintenance Due  Topic Date Due  . HIV Screening  01/31/2010    There are no preventive care reminders to display for this patient.  No results found for: TSH Lab Results  Component Value Date   WBC 7.1 04/18/2018   HGB 14.0 04/18/2018   HCT 39.9 04/18/2018   MCV 89.3 04/18/2018  PLT 214 04/18/2018   Lab Results  Component Value Date   NA 139 04/18/2018   K 3.7 04/18/2018   CO2 28 04/18/2018   GLUCOSE 82 04/18/2018   BUN 9 04/18/2018   CREATININE 0.80 04/18/2018   BILITOT 0.6 04/18/2018   AST 19 04/18/2018   ALT 13 04/18/2018   PROT 6.6 04/18/2018   CALCIUM 9.6 04/18/2018   Lab Results  Component Value Date   CHOL 183 04/18/2018   Lab Results  Component Value Date   HDL 62 04/18/2018   Lab Results  Component Value Date   LDLCALC 102 (H) 04/18/2018   Lab Results  Component Value Date   TRIG 96 04/18/2018   Lab Results  Component Value Date   CHOLHDL 3.0 04/18/2018   No results found for: HGBA1C    Assessment & Plan:  PPD placement - Patient tolerated injection well without complications. Patient advised to schedule PPD read in 48-72 hours.    Problem List Items Addressed This Visit    None    Visit Diagnoses    Screening-pulmonary TB    -  Primary   Relevant Orders   PPD (Completed)      No orders of the defined types were  placed in this encounter.   Follow-up: Return in about 2 days (around 04/16/2019) for PPD read.    Lavell Luster, [place]

## 2019-04-17 ENCOUNTER — Other Ambulatory Visit: Payer: Self-pay

## 2019-04-17 ENCOUNTER — Ambulatory Visit (INDEPENDENT_AMBULATORY_CARE_PROVIDER_SITE_OTHER): Payer: Managed Care, Other (non HMO) | Admitting: Physician Assistant

## 2019-04-17 VITALS — BP 105/62 | HR 58 | Wt 107.0 lb

## 2019-04-17 DIAGNOSIS — Z111 Encounter for screening for respiratory tuberculosis: Secondary | ICD-10-CM | POA: Diagnosis not present

## 2019-04-17 LAB — TB SKIN TEST
Induration: 0 mm
TB Skin Test: NEGATIVE

## 2019-04-17 NOTE — Progress Notes (Signed)
Established Patient Office Visit  Subjective:  Patient ID: Madison Carpenter, female    DOB: November 22, 1995  Age: 23 y.o. MRN: [id number]  CC:  Chief Complaint  Patient presents with  . PPD Reading    HPI Madison Carpenter presents for PPD reading.   Past Medical History:  Diagnosis Date  . Anxiety   . Depression     History reviewed. No pertinent surgical history.  Family History  Problem Relation Age of Onset  . Breast cancer Maternal Grandmother   . Colon cancer Neg Hx     Social History   Socioeconomic History  . Marital status: Single    Spouse name: Not on file  . Number of children: Not on file  . Years of education: Not on file  . Highest education level: Not on file  Occupational History  . Not on file  Social Needs  . Financial resource strain: Not on file  . Food insecurity    Worry: Not on file    Inability: Not on file  . Transportation needs    Medical: Not on file    Non-medical: Not on file  Tobacco Use  . Smoking status: Never Smoker  . Smokeless tobacco: Never Used  Substance and Sexual Activity  . Alcohol use: Yes    Comment: socially  . Drug use: No  . Sexual activity: Yes    Partners: Male    Birth control/protection: I.U.D.  Lifestyle  . Physical activity    Days per week: Not on file    Minutes per session: Not on file  . Stress: Not on file  Relationships  . Social Herbalist on phone: Not on file    Gets together: Not on file    Attends religious service: Not on file    Active member of club or organization: Not on file    Attends meetings of clubs or organizations: Not on file    Relationship status: Not on file  . Intimate partner violence    Fear of current or ex partner: Not on file    Emotionally abused: Not on file    Physically abused: Not on file    Forced sexual activity: Not on file  Other Topics Concern  . Not on file  Social History Narrative  . Not on file    Outpatient Medications Prior to Visit   Medication Sig Dispense Refill  . FLUoxetine (PROZAC) 10 MG capsule Take 1 capsule (10 mg total) by mouth daily. 90 capsule 3  . levonorgestrel (MIRENA) 20 MCG/24HR IUD 1 Intra Uterine Device (1 each total) by Intrauterine route once for 1 dose. Placed 02/2019 1 each 0   No facility-administered medications prior to visit.     No Known Allergies  ROS Review of Systems    Objective:    Physical Exam  BP 105/62   Pulse (!) 58   Wt 107 lb (48.5 kg)   SpO2 99%  Wt Readings from Last 3 Encounters:  04/17/19 107 lb (48.5 kg)  04/14/19 108 lb (49 kg)  03/24/19 109 lb 6.4 oz (49.6 kg)     Health Maintenance Due  Topic Date Due  . HIV Screening  01/31/2010    There are no preventive care reminders to display for this patient.  No results found for: TSH Lab Results  Component Value Date   WBC 7.1 04/18/2018   HGB 14.0 04/18/2018   HCT 39.9 04/18/2018   MCV 89.3 04/18/2018  PLT 214 04/18/2018   Lab Results  Component Value Date   NA 139 04/18/2018   K 3.7 04/18/2018   CO2 28 04/18/2018   GLUCOSE 82 04/18/2018   BUN 9 04/18/2018   CREATININE 0.80 04/18/2018   BILITOT 0.6 04/18/2018   AST 19 04/18/2018   ALT 13 04/18/2018   PROT 6.6 04/18/2018   CALCIUM 9.6 04/18/2018   Lab Results  Component Value Date   CHOL 183 04/18/2018   Lab Results  Component Value Date   HDL 62 04/18/2018   Lab Results  Component Value Date   LDLCALC 102 (H) 04/18/2018   Lab Results  Component Value Date   TRIG 96 04/18/2018   Lab Results  Component Value Date   CHOLHDL 3.0 04/18/2018   No results found for: HGBA1C    Assessment & Plan:  PPD read- negative with 0 mm.    Problem List Items Addressed This Visit    None    Visit Diagnoses    Screening-pulmonary TB    -  Primary      No orders of the defined types were placed in this encounter.   Follow-up: No follow-ups on file.    Lavell Luster, [place]

## 2019-04-23 ENCOUNTER — Encounter: Payer: Self-pay | Admitting: Physician Assistant

## 2019-05-04 ENCOUNTER — Other Ambulatory Visit: Payer: Managed Care, Other (non HMO)

## 2019-05-05 ENCOUNTER — Other Ambulatory Visit: Payer: Managed Care, Other (non HMO)

## 2019-05-06 ENCOUNTER — Ambulatory Visit
Admission: RE | Admit: 2019-05-06 | Discharge: 2019-05-06 | Disposition: A | Discharge status: Home / Self Care | Payer: Managed Care, Other (non HMO) | Source: Ambulatory Visit | Attending: Osteopathic Medicine | Admitting: Osteopathic Medicine

## 2019-05-06 ENCOUNTER — Other Ambulatory Visit: Payer: Self-pay

## 2019-05-06 ENCOUNTER — Encounter: Payer: Self-pay | Admitting: Osteopathic Medicine

## 2019-05-06 DIAGNOSIS — N6310 Unspecified lump in the right breast, unspecified quadrant: Secondary | ICD-10-CM

## 2019-05-06 DIAGNOSIS — D249 Benign neoplasm of unspecified breast: Secondary | ICD-10-CM | POA: Insufficient documentation

## 2019-08-20 ENCOUNTER — Other Ambulatory Visit: Payer: Self-pay | Admitting: Osteopathic Medicine

## 2019-08-20 ENCOUNTER — Encounter: Payer: Self-pay | Admitting: Osteopathic Medicine

## 2019-08-20 DIAGNOSIS — Z8659 Personal history of other mental and behavioral disorders: Secondary | ICD-10-CM

## 2019-08-20 MED ORDER — FLUOXETINE HCL 10 MG PO CAPS: 10.0000 mg | ORAL_CAPSULE | Freq: Every day | ORAL | 0 refills | Status: DC

## 2019-09-28 ENCOUNTER — Other Ambulatory Visit: Payer: Self-pay | Admitting: Osteopathic Medicine

## 2019-09-28 DIAGNOSIS — Z8659 Personal history of other mental and behavioral disorders: Secondary | ICD-10-CM

## 2019-09-28 MED ORDER — FLUOXETINE HCL 10 MG PO CAPS: 10.0000 mg | ORAL_CAPSULE | Freq: Every day | ORAL | 0 refills | Status: DC

## 2019-10-12 ENCOUNTER — Encounter: Payer: Self-pay | Admitting: Osteopathic Medicine

## 2019-10-12 ENCOUNTER — Telehealth (INDEPENDENT_AMBULATORY_CARE_PROVIDER_SITE_OTHER): Payer: Managed Care, Other (non HMO) | Admitting: Osteopathic Medicine

## 2019-10-12 VITALS — Ht 61.0 in | Wt 106.0 lb

## 2019-10-12 DIAGNOSIS — Z8659 Personal history of other mental and behavioral disorders: Secondary | ICD-10-CM | POA: Diagnosis not present

## 2019-10-12 DIAGNOSIS — Z0000 Encounter for general adult medical examination without abnormal findings: Secondary | ICD-10-CM

## 2019-10-12 MED ORDER — FLUOXETINE HCL 10 MG PO CAPS: 10.0000 mg | ORAL_CAPSULE | Freq: Every day | ORAL | 3 refills | Status: DC

## 2019-10-12 NOTE — Patient Instructions (Addendum)
General Preventive Care  Most recent routine labs: order next year!    Everyone should have blood pressure checked once per year.   Tobacco: don't!   Alcohol: responsible moderation is ok for most adults - if you have concerns about your alcohol intake, please talk to me!   Exercise: as tolerated to reduce risk of cardiovascular disease and diabetes. Strength training will also prevent osteoporosis.   Mental health: if need for mental health care (medicines, counseling, other), or concerns about moods, please let me know!   Sexual health: if need for STD testing, or if concerns with libido/pain problems, please let me know! If you need to discuss your birth control options, please let me know!   Advanced Directive: Living Will and/or Healthcare Power of Attorney recommended for all adults, regardless of age or health.  Vaccines  Flu vaccine: recommended for almost everyone, every fall.   Shingles vaccine: recommended after age 74.   Pneumonia vaccines: recommended after age 24, or sooner if certain medical conditions.  Tetanus booster: Tdap recommended every 10 years. Due 2029  HPV vaccine: Gardasil recommended up to age 50 to prevent HPV-associated diseases, including certain cancers.   COVID vaccine: recommended for everyone! Cancer screenings   Colon cancer screening: recommended for everyone at age 87, but some folks need a colonoscopy sooner if risk factors or family history   Breast cancer screening: mammogram recommended at age 3   Cervical cancer screening: Pap every 1 to 5 years depending on age and other risk factors. Can usually stop at age 105 or w/ hysterectomy.   Lung cancer screening: not needed for non-smokers  Infection screenings . HIV: recommended screening at least once age 13-65, more often as needed. . Gonorrhea/Chlamydia: screening as needed . Hepatitis C: recommended for anyone born 61-1966 . TB: certain at-risk populations, or depending on work  requirements and/or travel history Other . Bone Density Test: recommended for women at age 53

## 2019-10-12 NOTE — Progress Notes (Signed)
Virtual Visit via Video (App used: MyChart ) Note  I connected with      Madison Carpenter on 10/12/19 at 9:48 AM  by a telemedicine application and verified that I am speaking with the correct person using two identifiers.  Patient is at work  I am in office   I discussed the limitations of evaluation and management by telemedicine and the availability of in person appointments. The patient expressed understanding and agreed to proceed.  History of Present Illness: Madison Carpenter is a 24 y.o. female who would like to discuss annual physical and refill prozac     Patient here for annual physical / wellness exam.  See preventive care reviewed as below.  Recent labs reviewed in detail with the patient from 04/2018  Additional concerns today include:  Just needs refill on Prozac, doing well COVID shot: First one last week, second one is scheduled       Observations/Objective: Ht 5\' 1"  (1.549 m)   Wt 106 lb (48.1 kg)   BMI 20.03 kg/m  BP Readings from Last 3 Encounters:  04/17/19 105/62  04/14/19 121/65  03/24/19 104/62   Exam: Normal Speech.  NAD  Lab and Radiology Results No results found for this or any previous visit (from the past 72 hour(s)). No results found.     Assessment and Plan: 24 y.o. female with The primary encounter diagnosis was Annual physical exam. A diagnosis of History of depression was also pertinent to this visit.   PDMP not reviewed this encounter. No orders of the defined types were placed in this encounter.  Meds ordered this encounter  Medications  . FLUoxetine (PROZAC) 10 MG capsule    Sig: Take 1 capsule (10 mg total) by mouth daily.    Dispense:  90 capsule    Refill:  3   Patient Instructions  General Preventive Care  Most recent routine labs: order next year!    Everyone should have blood pressure checked once per year.   Tobacco: don't!   Alcohol: responsible moderation is ok for most adults - if you have concerns  about your alcohol intake, please talk to me!   Exercise: as tolerated to reduce risk of cardiovascular disease and diabetes. Strength training will also prevent osteoporosis.   Mental health: if need for mental health care (medicines, counseling, other), or concerns about moods, please let me know!   Sexual health: if need for STD testing, or if concerns with libido/pain problems, please let me know! If you need to discuss your birth control options, please let me know!   Advanced Directive: Living Will and/or Healthcare Power of Attorney recommended for all adults, regardless of age or health.  Vaccines  Flu vaccine: recommended for almost everyone, every fall.   Shingles vaccine: recommended after age 62.   Pneumonia vaccines: recommended after age 45, or sooner if certain medical conditions.  Tetanus booster: Tdap recommended every 10 years. Due 2029  HPV vaccine: Gardasil recommended up to age 44 to prevent HPV-associated diseases, including certain cancers.   COVID vaccine: recommended for everyone! Cancer screenings   Colon cancer screening: recommended for everyone at age 75, but some folks need a colonoscopy sooner if risk factors or family history   Breast cancer screening: mammogram recommended at age 37   Cervical cancer screening: Pap every 1 to 5 years depending on age and other risk factors. Can usually stop at age 68 or w/ hysterectomy.   Lung cancer screening: not needed for  non-smokers  Infection screenings . HIV: recommended screening at least once age 70-65, more often as needed. . Gonorrhea/Chlamydia: screening as needed . Hepatitis C: recommended for anyone born 25-1966 . TB: certain at-risk populations, or depending on work requirements and/or travel history Other . Bone Density Test: recommended for women at age 41   Instructions sent via [place]. If MyChart not available, pt was given option for info via personal e-mail w/ no guarantee of protected  health info over unsecured e-mail communication, and MyChart sign-up instructions were sent to patient.   Follow Up Instructions: Return in about 1 year (around 10/11/2020) for [place] (call week prior to visit for lab orders).    I discussed the assessment and treatment plan with the patient. The patient was provided an opportunity to ask questions and all were answered. The patient agreed with the plan and demonstrated an understanding of the instructions.   The patient was advised to call back or seek an in-person evaluation if any new concerns, if symptoms worsen or if the condition fails to improve as anticipated.  20 minutes of non-face-to-face time was provided during this encounter.      . . . . . . . . . . . . . Marland Kitchen                   Historical information moved to improve visibility of documentation.  Past Medical History:  Diagnosis Date  . Anxiety   . Depression    No past surgical history on file. Social History   Tobacco Use  . Smoking status: Never Smoker  . Smokeless tobacco: Never Used  Substance Use Topics  . Alcohol use: Yes    Comment: socially   family history includes Breast cancer in her maternal grandmother.  Medications: Current Outpatient Medications  Medication Sig Dispense Refill  . FLUoxetine (PROZAC) 10 MG capsule Take 1 capsule (10 mg total) by mouth daily. 90 capsule 3  . levonorgestrel (MIRENA) 20 MCG/24HR IUD 1 Intra Uterine Device (1 each total) by Intrauterine route once for 1 dose. Placed 02/2019 1 each 0   No current facility-administered medications for this visit.   No Known Allergies

## 2020-06-17 IMAGING — US ULTRASOUND RIGHT BREAST LIMITED
1 series · 6 of 6 positions shown · non-contrast
Comparison: None.

CLINICAL DATA: 22-year-old female. Patient's physician palpated an
abnormality in the upper-outer quadrant of the right breast.

EXAM:
ULTRASOUND OF THE RIGHT BREAST

[Series 1: ultrasound right breast limited · 0.04mm/px · 6 of 6 slices shown]
[im 1/6]
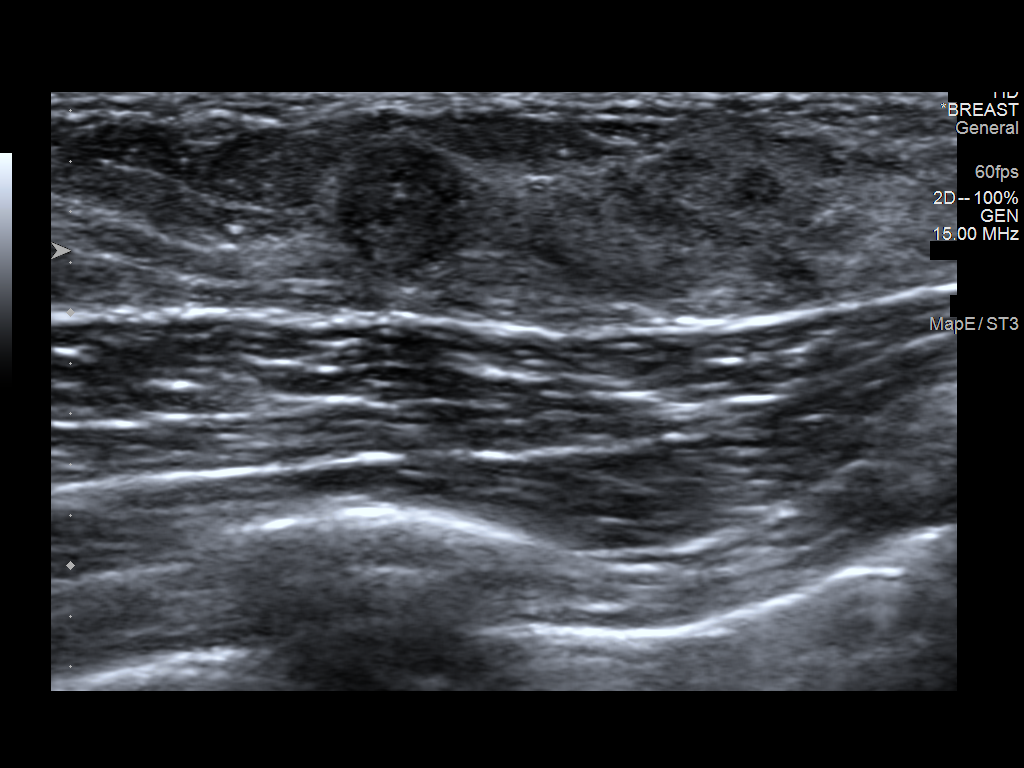
[im 2/6]
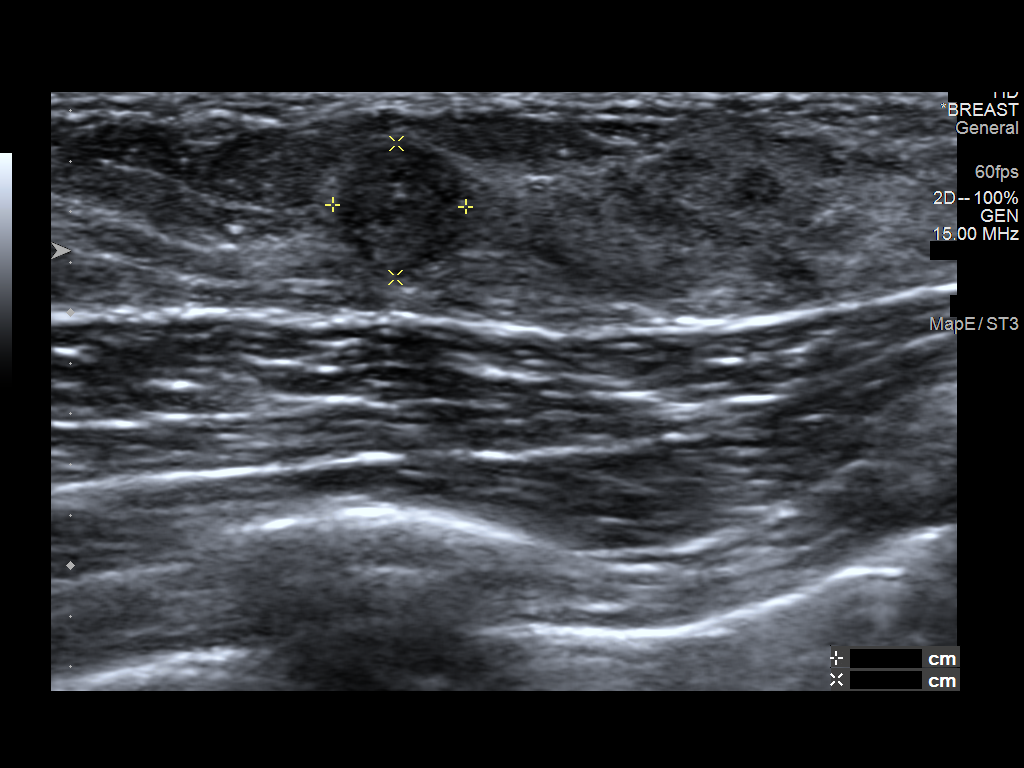
[im 3/6]
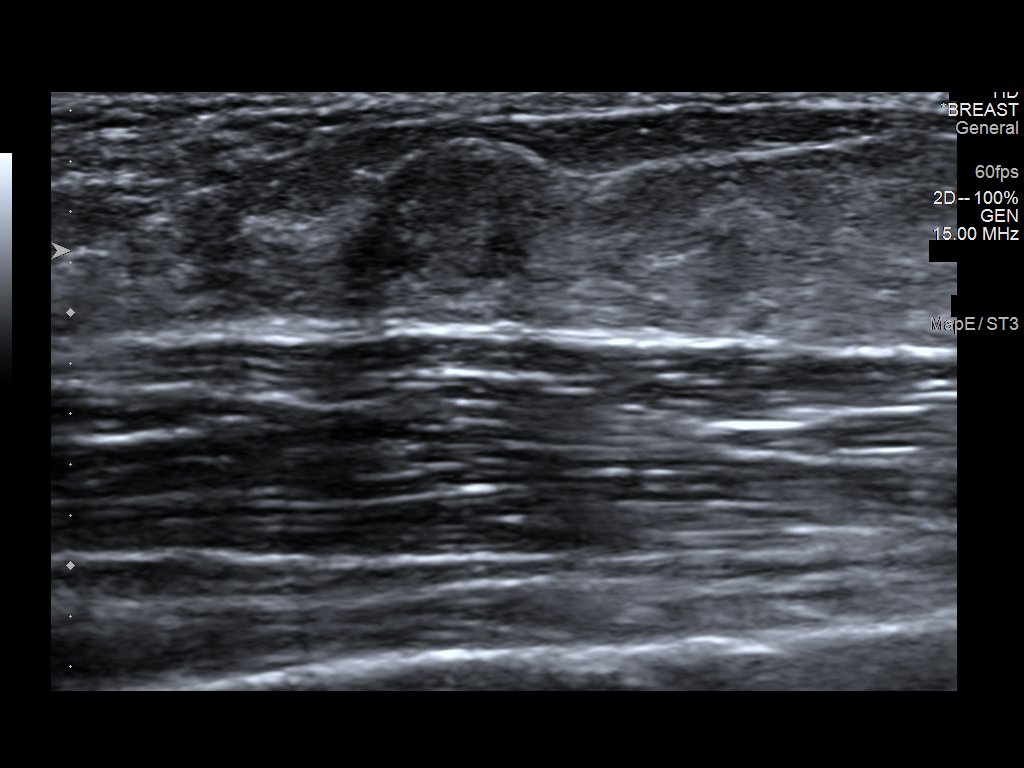
[im 4/6]
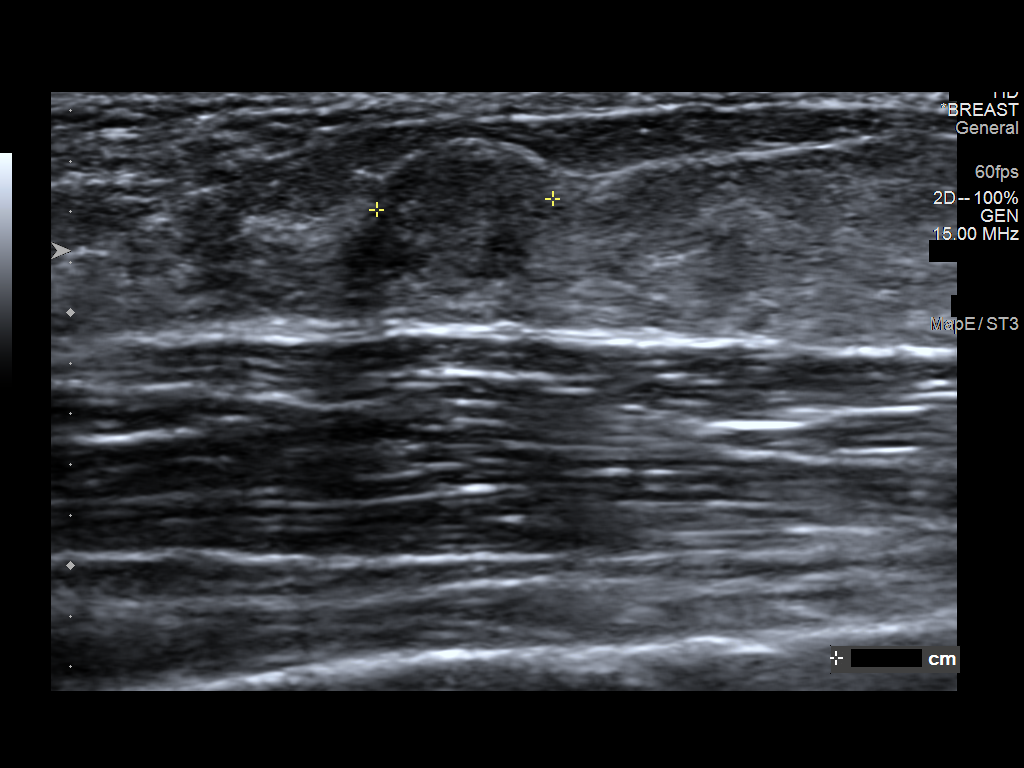
[im 5/6]
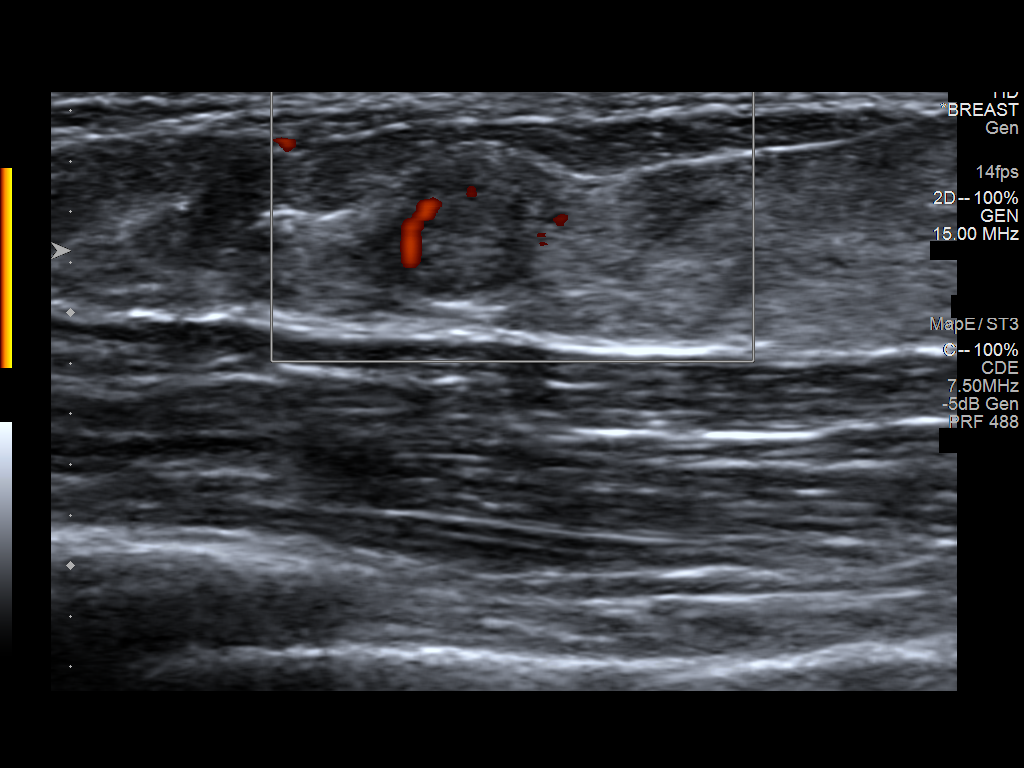
[im 6/6]
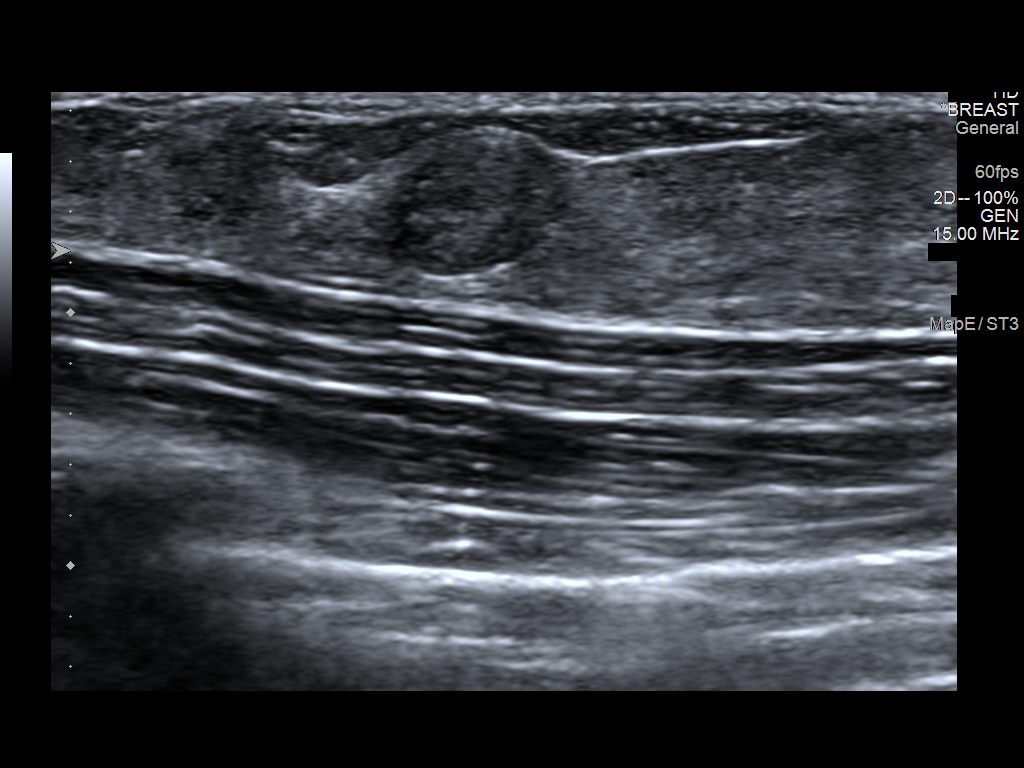

[6 of 6 positions shown; findings below may reference images not displayed]

FINDINGS: On physical exam, I do not palpate a mass in the upper-outer
quadrant of the right breast.

Targeted ultrasound is performed, showing a well-circumscribed
hypoechoic mass in the right breast at 10 o'clock 5 cm from the
nipple measuring 5 x 5 x 7 mm. It is thought to likely be a benign
fibroadenoma.
IMPRESSION: Probable benign fibroadenoma in the right breast.

RECOMMENDATION:
Short-term interval follow-up right breast ultrasound in 6 months is
recommended. Options of ultrasound-guided core biopsy and surgical
excision were also discussed with the patient.

I have discussed the findings and recommendations with the patient.
Results were also provided in writing at the conclusion of the
visit. If applicable, a reminder letter will be sent to the patient
regarding the next appointment.

BI-RADS CATEGORY  3: Probably benign.

## 2020-07-25 ENCOUNTER — Other Ambulatory Visit: Payer: Self-pay | Admitting: Osteopathic Medicine

## 2020-07-25 DIAGNOSIS — D241 Benign neoplasm of right breast: Secondary | ICD-10-CM

## 2020-08-29 ENCOUNTER — Other Ambulatory Visit: Payer: Self-pay

## 2020-08-29 ENCOUNTER — Ambulatory Visit
Admission: RE | Admit: 2020-08-29 | Discharge: 2020-08-29 | Disposition: A | Discharge status: Home / Self Care | Payer: Managed Care, Other (non HMO) | Source: Ambulatory Visit | Attending: Osteopathic Medicine | Admitting: Osteopathic Medicine

## 2020-08-29 DIAGNOSIS — D241 Benign neoplasm of right breast: Secondary | ICD-10-CM

## 2020-10-14 ENCOUNTER — Other Ambulatory Visit: Payer: Self-pay

## 2020-10-14 DIAGNOSIS — Z8659 Personal history of other mental and behavioral disorders: Secondary | ICD-10-CM

## 2020-10-14 MED ORDER — FLUOXETINE HCL 10 MG PO CAPS: 10.0000 mg | ORAL_CAPSULE | Freq: Every day | ORAL | 3 refills | Status: DC

## 2020-11-02 ENCOUNTER — Ambulatory Visit (INDEPENDENT_AMBULATORY_CARE_PROVIDER_SITE_OTHER): Payer: Managed Care, Other (non HMO) | Admitting: Osteopathic Medicine

## 2020-11-02 ENCOUNTER — Encounter: Payer: Self-pay | Admitting: Osteopathic Medicine

## 2020-11-02 ENCOUNTER — Other Ambulatory Visit: Payer: Self-pay

## 2020-11-02 VITALS — BP 123/77 | HR 57 | Temp 98.6°F | Resp 20 | Ht 61.0 in | Wt 113.0 lb

## 2020-11-02 DIAGNOSIS — Z0000 Encounter for general adult medical examination without abnormal findings: Secondary | ICD-10-CM

## 2020-11-02 NOTE — Patient Instructions (Addendum)
General Preventive Care  Most recent routine labs: ordered today   Everyone should have blood pressure checked once per year.   Tobacco: don't!   Alcohol: responsible moderation is ok for most adults - if you have concerns about your alcohol intake, please talk to me!   Exercise: as tolerated to reduce risk of cardiovascular disease and diabetes. Strength training will also prevent osteoporosis.   Mental health: if need for mental health care (medicines, counseling, other), or concerns about moods, please let me know!   Sexual health: if need for STD testing, or if concerns with libido/pain problems, please let me know! If you need to discuss your birth control options, please let me know!   Advanced Directive: Living Will and/or Healthcare Power of Attorney recommended for all adults, regardless of age or health.  Vaccines  Flu vaccine: recommended  every fall/winter.   Shingles vaccine: recommended after age 24.   Pneumonia vaccines: recommended after age 43  Tetanus booster: Tdap recommended every 10 years. Due 2029  HPV vaccine: Gardasil recommended up to age 52 to prevent HPV-associated diseases, including certain cancers.   COVID vaccine: recommended for everyone! Cancer screenings   Colon cancer screening: recommended for everyone at age 86.  Breast cancer screening: mammogram recommended at age 75   Cervical cancer screening: Pap due 03/2021 (3 years after last Pap).   Lung cancer screening: not needed for non-smokers  Infection screenings  HIV: recommended screening at least once age 24-65.  Gonorrhea/Chlamydia: screening as needed.   Hepatitis C: recommended for anyone born [id number]  TB: certain at-risk populations, or depending on work requirements and/or travel history Other  Bone Density Test: recommended for women at age 31

## 2020-11-02 NOTE — Progress Notes (Signed)
Madison Carpenter is a 25 y.o. female who presents to  [place] at [facility]  today, 11/02/20, seeking care for the following:  . Annual physical      ASSESSMENT & PLAN with other pertinent findings:  The encounter diagnosis was Annual physical exam.    Patient Instructions  General Preventive Care  Most recent routine labs: ordered today   Everyone should have blood pressure checked once per year.   Tobacco: don't!   Alcohol: responsible moderation is ok for most adults - if you have concerns about your alcohol intake, please talk to me!   Exercise: as tolerated to reduce risk of cardiovascular disease and diabetes. Strength training will also prevent osteoporosis.   Mental health: if need for mental health care (medicines, counseling, other), or concerns about moods, please let me know!   Sexual health: if need for STD testing, or if concerns with libido/pain problems, please let me know! If you need to discuss your birth control options, please let me know!   Advanced Directive: Living Will and/or Healthcare Power of Attorney recommended for all adults, regardless of age or health.  Vaccines  Flu vaccine: recommended  every fall/winter.   Shingles vaccine: recommended after age 7.   Pneumonia vaccines: recommended after age 68  Tetanus booster: Tdap recommended every 10 years. Due 2029  HPV vaccine: Gardasil recommended up to age 37 to prevent HPV-associated diseases, including certain cancers.   COVID vaccine: recommended for everyone! Cancer screenings   Colon cancer screening: recommended for everyone at age 23.  Breast cancer screening: mammogram recommended at age 12   Cervical cancer screening: Pap due 03/2021 (3 years after last Pap).   Lung cancer screening: not needed for non-smokers  Infection screenings  HIV: recommended screening at least once age 51-65.  Gonorrhea/Chlamydia: screening as needed.    Hepatitis C: recommended for anyone born [id number]  TB: certain at-risk populations, or depending on work requirements and/or travel history Other  Bone Density Test: recommended for women at age 71   Orders Placed This Encounter  Procedures  . CBC  . COMPLETE METABOLIC PANEL WITH GFR  . Lipid panel    No orders of the defined types were placed in this encounter.    See below for relevant physical exam findings  See below for recent lab and imaging results reviewed  Medications, allergies, PMH, PSH, SocH, FamH reviewed below    Follow-up instructions: Return in about 1 year (around 11/02/2021) for ANNUAL CHECK-UP - SEE [place] SOONER IF NEEDED.                                        Exam:  BP 123/77 (BP Location: Left Arm, Cuff Size: Normal)   Pulse (!) 57   Temp 98.6 F (37 C) (Oral)   Resp 20   Ht 5\' 1"  (1.549 m)   Wt 113 lb (51.3 kg)   SpO2 99%   BMI 21.35 kg/m   Constitutional: VS see above. General Appearance: alert, well-developed, well-nourished, NAD  Neck: No masses, trachea midline.   Respiratory: Normal respiratory effort. no wheeze, no rhonchi, no rales  Cardiovascular: S1/S2 normal, no murmur, no rub/gallop auscultated. RRR.   Musculoskeletal: Gait normal. Symmetric and independent movement of all extremities  Abdominal: non-tender, non-distended, no appreciable organomegaly, neg Murphy's, BS WNLx4  Neurological: Normal balance/coordination. No tremor.  Skin: warm,  dry, intact.   Psychiatric: Normal judgment/insight. Normal mood and affect. Oriented x3.   Current Meds  Medication Sig  . FLUoxetine (PROZAC) 10 MG capsule Take 1 capsule (10 mg total) by mouth daily.    No Known Allergies  Patient Active Problem List   Diagnosis Date Noted  . 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 History of syncope 08/14/2017  . History of depression 08/05/2017  . IUD (intrauterine device) in place 08/05/2017    Family  History  Problem Relation Age of Onset  . Breast cancer Maternal Grandmother   . Colon cancer Neg Hx     Social History   Tobacco Use  Smoking Status Never Smoker  Smokeless Tobacco Never Used    No past surgical history on file.  Immunization History  Administered Date(s) Administered  . Influenza,inj,Quad PF,6+ Mos 06/24/2019  . Influenza-Unspecified 07/17/2018  . PFIZER(Purple Top)SARS-COV-2 Vaccination 09/18/2019, 10/19/2019, 07/18/2020  . PPD Test 04/16/2018, 04/14/2019  . Tdap 04/16/2018    No results found for this or any previous visit (from the past 2160 hour(s)).  No results found.     All questions at time of visit were answered - patient instructed to contact office with any additional concerns or updates. ER/RTC precautions were reviewed with the patient as applicable.   Please note: manual typing as well as voice recognition software may have been used to produce this document - typos may escape review. Please contact Dr. Sheppard Coil for any needed clarifications.

## 2020-11-03 LAB — COMPLETE METABOLIC PANEL WITH GFR
AG Ratio: 1.9 (calc) (ref 1.0–2.5)
ALT: 13 U/L (ref 6–29)
AST: 16 U/L (ref 10–30)
Albumin: 4.6 g/dL (ref 3.6–5.1)
Alkaline phosphatase (APISO): 49 U/L (ref 31–125)
BUN: 13 mg/dL (ref 7–25)
CO2: 27 mmol/L (ref 20–32)
Calcium: 9.8 mg/dL (ref 8.6–10.2)
Chloride: 104 mmol/L (ref 98–110)
Creat: 0.85 mg/dL (ref 0.50–1.10)
GFR, Est African American: 110 mL/min/{1.73_m2} (ref >=60)
GFR, Est Non African American: 95 mL/min/{1.73_m2} (ref >=60)
Globulin: 2.4 g/dL (calc) (ref 1.9–3.7)
Glucose, Bld: 87 mg/dL (ref 65–139)
Potassium: 4.3 mmol/L (ref 3.5–5.3)
Sodium: 139 mmol/L (ref 135–146)
Total Bilirubin: 0.6 mg/dL (ref 0.2–1.2)
Total Protein: 7 g/dL (ref 6.1–8.1)

## 2020-11-03 LAB — LIPID PANEL
Cholesterol: 209 mg/dL — ABNORMAL HIGH (ref <200)
HDL: 75 mg/dL (ref >=50)
LDL Cholesterol (Calc): 120 mg/dL (calc) — ABNORMAL HIGH
Non-HDL Cholesterol (Calc): 134 mg/dL (calc) — ABNORMAL HIGH (ref <130)
Total CHOL/HDL Ratio: 2.8 (calc) (ref <5.0)
Triglycerides: 50 mg/dL (ref <150)

## 2020-11-03 LAB — CBC
HCT: 45.1 % — ABNORMAL HIGH (ref 35.0–45.0)
Hemoglobin: 15.2 g/dL (ref 11.7–15.5)
MCH: 31.6 pg (ref 27.0–33.0)
MCHC: 33.7 g/dL (ref 32.0–36.0)
MCV: 93.8 fL (ref 80.0–100.0)
MPV: 10.5 fL (ref 7.5–12.5)
Platelets: 257 10*3/uL (ref 140–400)
RBC: 4.81 10*6/uL (ref 3.80–5.10)
RDW: 11.3 % (ref 11.0–15.0)
WBC: 5.5 10*3/uL (ref 3.8–10.8)

## 2020-12-11 ENCOUNTER — Encounter: Payer: Self-pay | Admitting: Osteopathic Medicine

## 2020-12-20 IMAGING — US ULTRASOUND RIGHT BREAST LIMITED
1 series · 4 of 4 positions shown · non-contrast
Comparison: Previous exam(s).

CLINICAL DATA: Follow-up for probably benign fibroadenoma in the
RIGHT breast, originally identified on ultrasound dated 04/28/2018.

EXAM:
ULTRASOUND OF THE RIGHT BREAST

[Series 1: ultrasound right breast limited · 0.05mm/px · 4 of 4 slices shown]
[im 1/4]
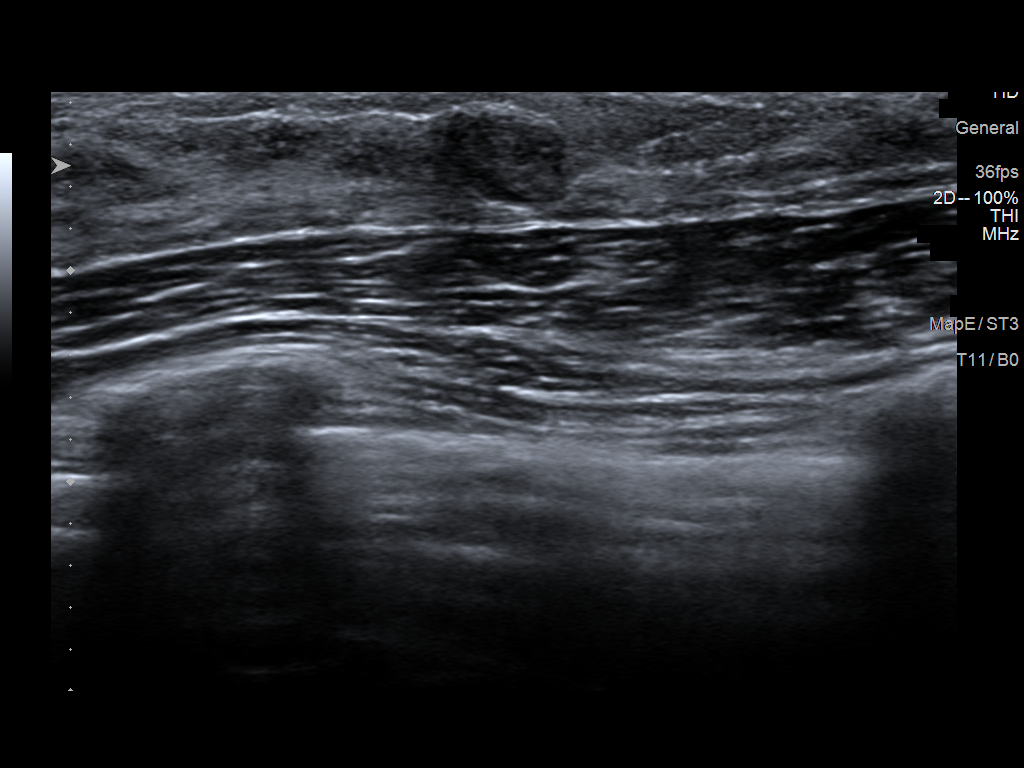
[im 2/4]
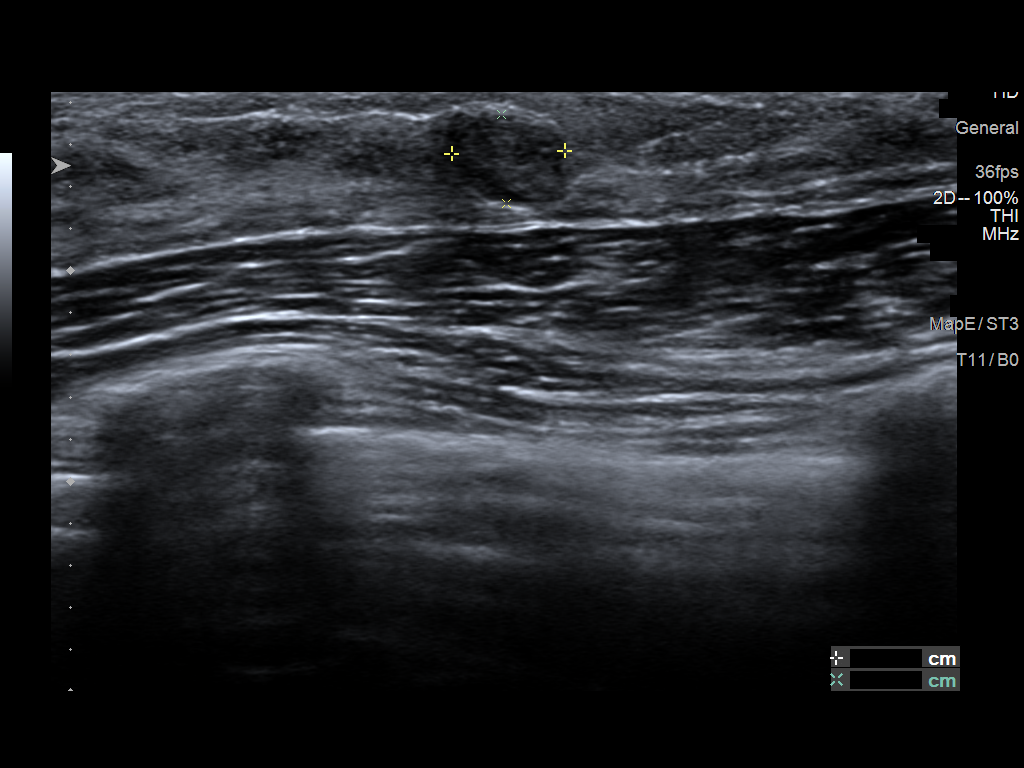
[im 3/4]
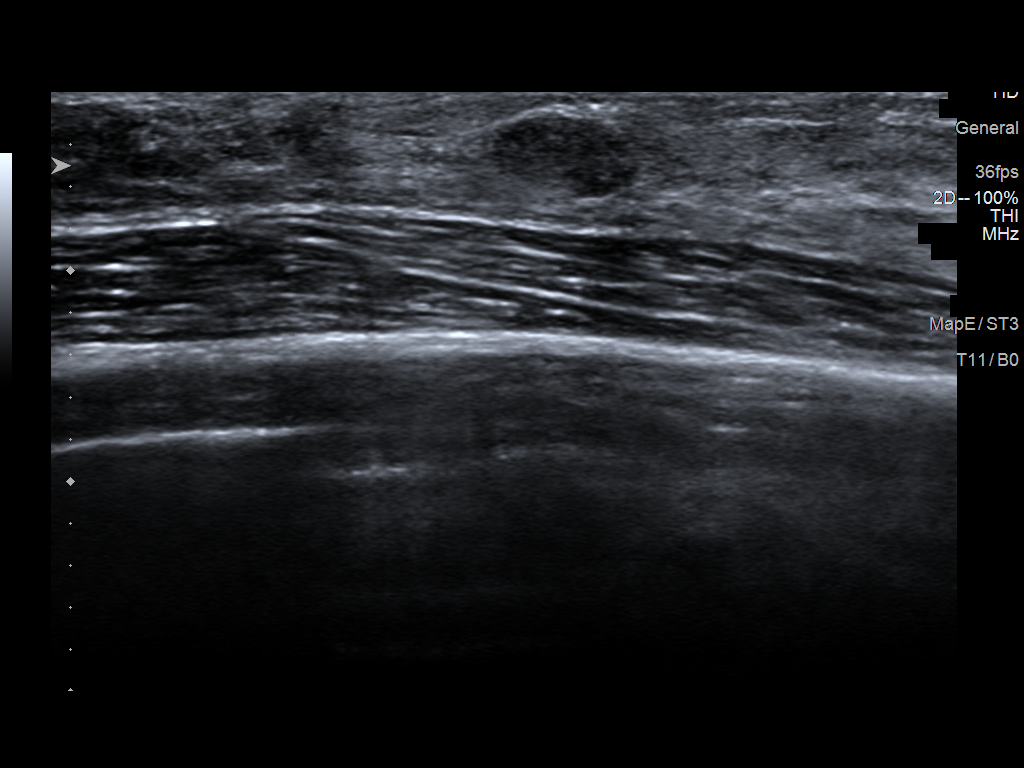
[im 4/4]
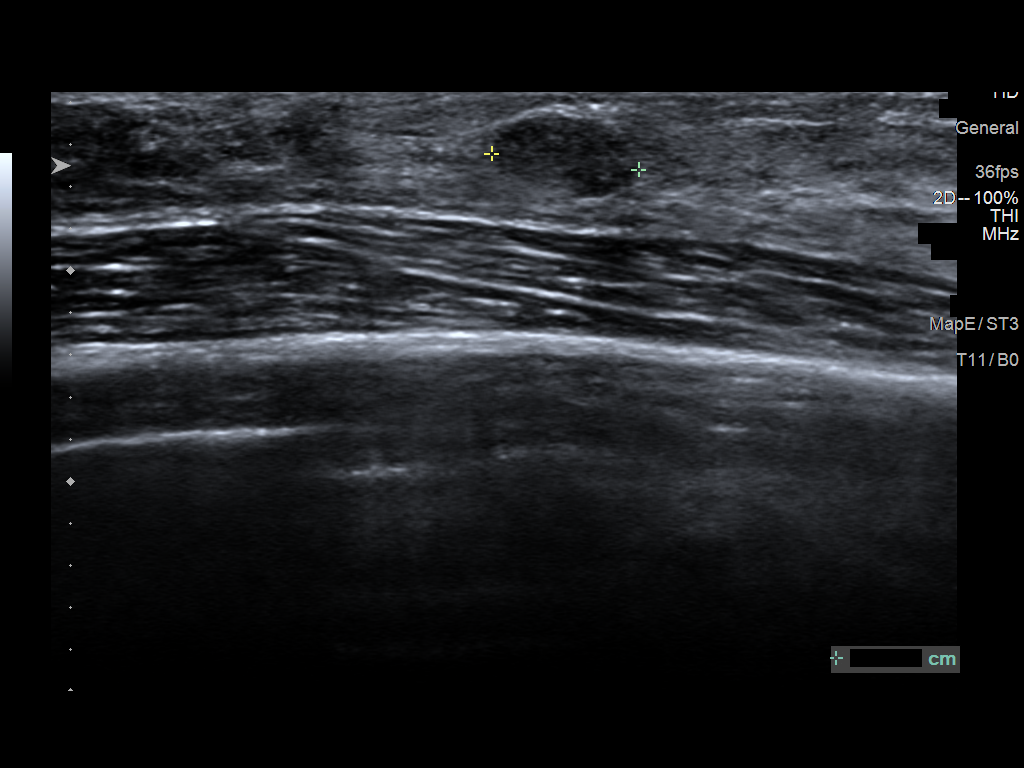

[4 of 4 positions shown; findings below may reference images not displayed]

FINDINGS: Targeted ultrasound is performed, again showing an oval
circumscribed hypoechoic mass in the RIGHT breast at the 10 o'clock
axis, 5 cm from the nipple, measuring 7 x 4 x 5 mm, stable compared
to the previous ultrasound of 04/28/2018.
IMPRESSION: Stable probably benign fibroadenoma in the RIGHT breast at the 10
o'clock axis, 5 cm from the nipple, measuring 7 x 4 x 5 mm.
Recommend additional follow-up ultrasound in 6 months to ensure
continued stability.

RECOMMENDATION:
RIGHT breast ultrasound in 6 months.

I have discussed the findings and recommendations with the patient.
Results were also provided in writing at the conclusion of the
visit. If applicable, a reminder letter will be sent to the patient
regarding the next appointment.

BI-RADS CATEGORY  3: Probably benign.

## 2020-12-21 ENCOUNTER — Other Ambulatory Visit: Payer: Self-pay

## 2020-12-21 ENCOUNTER — Ambulatory Visit (INDEPENDENT_AMBULATORY_CARE_PROVIDER_SITE_OTHER): Payer: Managed Care, Other (non HMO) | Admitting: Osteopathic Medicine

## 2020-12-21 ENCOUNTER — Encounter: Payer: Self-pay | Admitting: Osteopathic Medicine

## 2020-12-21 VITALS — BP 113/68 | HR 70 | Temp 98.6°F | Wt 113.0 lb

## 2020-12-21 DIAGNOSIS — M62838 Other muscle spasm: Secondary | ICD-10-CM

## 2020-12-21 MED ORDER — CYCLOBENZAPRINE HCL 10 MG PO TABS: 5.0000 mg | ORAL_TABLET | Freq: Three times a day (TID) | ORAL | 1 refills | Status: AC | PRN

## 2020-12-22 NOTE — Progress Notes (Signed)
Madison Carpenter is a 25 y.o. female who presents to  [place] at [facility]  today, 12/21/20, seeking care for the following:  . Neck pain last week, initially seen by urgent care and was prescribed muscle relaxers which helped.  Was still having some neck pain, so she made an appointment to see me, she called [place] on Friday, today is Wednesday, she is feeling a bit better at this point but just wanted to come get checked out and get some advice on how to prevent this from happening in the future.     ASSESSMENT & PLAN with other pertinent findings:  The encounter diagnosis was Muscle spasms of neck.   Printed instructions given on neck spasm/strain, exercises for prevention.  Would have a low threshold for referral to physical therapy if symptoms continue.  Patient given a refill on cyclobenzaprine to have on hand as needed.  Okay also to take ibuprofen/Tylenol as needed as directed over-the-counter   There are no Patient Instructions on file for this visit.  No orders of the defined types were placed in this encounter.   Meds ordered this encounter  Medications  . cyclobenzaprine (FLEXERIL) 10 MG tablet    Sig: Take 0.5-1 tablets (5-10 mg total) by mouth 3 (three) times daily as needed for muscle spasms. Caution: can cause drowsiness    Dispense:  30 tablet    Refill:  1     See below for relevant physical exam findings  See below for recent lab and imaging results reviewed  Medications, allergies, PMH, PSH, SocH, FamH reviewed below    Follow-up instructions: Return if symptoms worsen or fail to improve.                                        Exam:  BP 113/68 (BP Location: Left Arm, Patient Position: Sitting, Cuff Size: Normal)   Pulse 70   Temp 98.6 F (37 C) (Oral)   Wt 113 lb (51.3 kg)   BMI 21.35 kg/m   Constitutional: VS see above. General Appearance: alert, well-developed,  well-nourished, NAD  Neck: No masses, trachea midline.   Respiratory: Normal respiratory effort. no wheeze, no rhonchi, no rales  Cardiovascular: S1/S2 normal, no murmur, no rub/gallop auscultated. RRR.   Musculoskeletal: Gait normal. Symmetric and independent movement of all extremities, no paraspinal muscle tenderness or midline tenderness to cervical/upper thoracic spine.  Normal neck range of motion.  Neurological: Normal balance/coordination. No tremor.  Skin: warm, dry, intact.   Psychiatric: Normal judgment/insight. Normal mood and affect. Oriented x3.   Current Meds  Medication Sig  . cyclobenzaprine (FLEXERIL) 10 MG tablet Take 0.5-1 tablets (5-10 mg total) by mouth 3 (three) times daily as needed for muscle spasms. Caution: can cause drowsiness  . FLUoxetine (PROZAC) 10 MG capsule Take 1 capsule (10 mg total) by mouth daily.    No Known Allergies  Patient Active Problem List   Diagnosis Date Noted  . 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 History of syncope 08/14/2017  . History of depression 08/05/2017  . IUD (intrauterine device) in place 08/05/2017    Family History  Problem Relation Age of Onset  . Breast cancer Maternal Grandmother   . Colon cancer Neg Hx     Social History   Tobacco Use  Smoking Status Never Smoker  Smokeless Tobacco Never Used    No  past surgical history on file.  Immunization History  Administered Date(s) Administered  . Influenza,inj,Quad PF,6+ Mos 06/24/2019  . Influenza-Unspecified 07/17/2018  . PFIZER(Purple Top)SARS-COV-2 Vaccination 09/18/2019, 10/19/2019, 07/18/2020  . PPD Test 04/16/2018, 04/14/2019  . Tdap 04/16/2018    Recent Results (from the past 2160 hour(s))  CBC     Status: Abnormal   Collection Time: 11/02/20 12:00 AM  Result Value Ref Range   WBC 5.5 3.8 - 10.8 Thousand/uL   RBC 4.81 3.80 - 5.10 Million/uL   Hemoglobin 15.2 11.7 - 15.5 g/dL   HCT 45.1 (H) 35.0 - 45.0 %   MCV 93.8 80.0 - 100.0 fL   MCH  31.6 27.0 - 33.0 pg   MCHC 33.7 32.0 - 36.0 g/dL   RDW 11.3 11.0 - 15.0 %   Platelets 257 140 - 400 Thousand/uL   MPV 10.5 7.5 - 12.5 fL  COMPLETE METABOLIC PANEL WITH GFR     Status: None   Collection Time: 11/02/20 12:00 AM  Result Value Ref Range   Glucose, Bld 87 65 - 139 mg/dL    Comment: .        Non-fasting reference interval .    BUN 13 7 - 25 mg/dL   Creat 0.85 0.50 - 1.10 mg/dL   GFR, Est Non African American 95 > OR = 60 mL/min/1.18m2   GFR, Est African American 110 > OR = 60 mL/min/1.58m2   BUN/Creatinine Ratio NOT APPLICABLE 6 - 22 (calc)   Sodium 139 135 - 146 mmol/L   Potassium 4.3 3.5 - 5.3 mmol/L   Chloride 104 98 - 110 mmol/L   CO2 27 20 - 32 mmol/L   Calcium 9.8 8.6 - 10.2 mg/dL   Total Protein 7.0 6.1 - 8.1 g/dL   Albumin 4.6 3.6 - 5.1 g/dL   Globulin 2.4 1.9 - 3.7 g/dL (calc)   AG Ratio 1.9 1.0 - 2.5 (calc)   Total Bilirubin 0.6 0.2 - 1.2 mg/dL   Alkaline phosphatase (APISO) 49 31 - 125 U/L   AST 16 10 - 30 U/L   ALT 13 6 - 29 U/L  Lipid panel     Status: Abnormal   Collection Time: 11/02/20 12:00 AM  Result Value Ref Range   Cholesterol 209 (H) <200 mg/dL   HDL 75 > OR = 50 mg/dL   Triglycerides 50 <150 mg/dL   LDL Cholesterol (Calc) 120 (H) mg/dL (calc)    Comment: Reference range: <100 . Desirable range <100 mg/dL for primary prevention;   <70 mg/dL for patients with CHD or diabetic patients  with > or = 2 CHD risk factors. Marland Kitchen LDL-C is now calculated using the Martin-Hopkins  calculation, which is a validated novel method providing  better accuracy than the Friedewald equation in the  estimation of LDL-C.  Cresenciano Genre et al. Annamaria Helling. 1324;401(02): 2061-2068  (http://education.[employer].com/faq/FAQ164)    Total CHOL/HDL Ratio 2.8 <5.0 (calc)   Non-HDL Cholesterol (Calc) 134 (H) <130 mg/dL (calc)    Comment: For patients with diabetes plus 1 major ASCVD risk  factor, treating to a non-HDL-C goal of <100 mg/dL  (LDL-C of <70 mg/dL) is  considered a therapeutic  option.     No results found.     All questions at time of visit were answered - patient instructed to contact office with any additional concerns or updates. ER/RTC precautions were reviewed with the patient as applicable.   Please note: manual typing as well as voice recognition software may have been used  to produce this document - typos may escape review. Please contact Dr. Sheppard Coil for any needed clarifications.

## 2021-06-25 IMAGING — US ULTRASOUND RIGHT BREAST LIMITED
1 series · 4 of 4 positions shown · non-contrast
Comparison: 10/31/2018, 04/28/2018

CLINICAL DATA: One year follow-up for probably benign fibroadenoma
in the RIGHT breast. Mass was initially palpated on patient's
physical exam by her physician. The patient is not able to feel a
mass.

EXAM:
ULTRASOUND OF THE RIGHT BREAST

[Series 1: ultrasound right breast limited · 0.05mm/px · 4 of 4 slices shown]
[im 1/4]
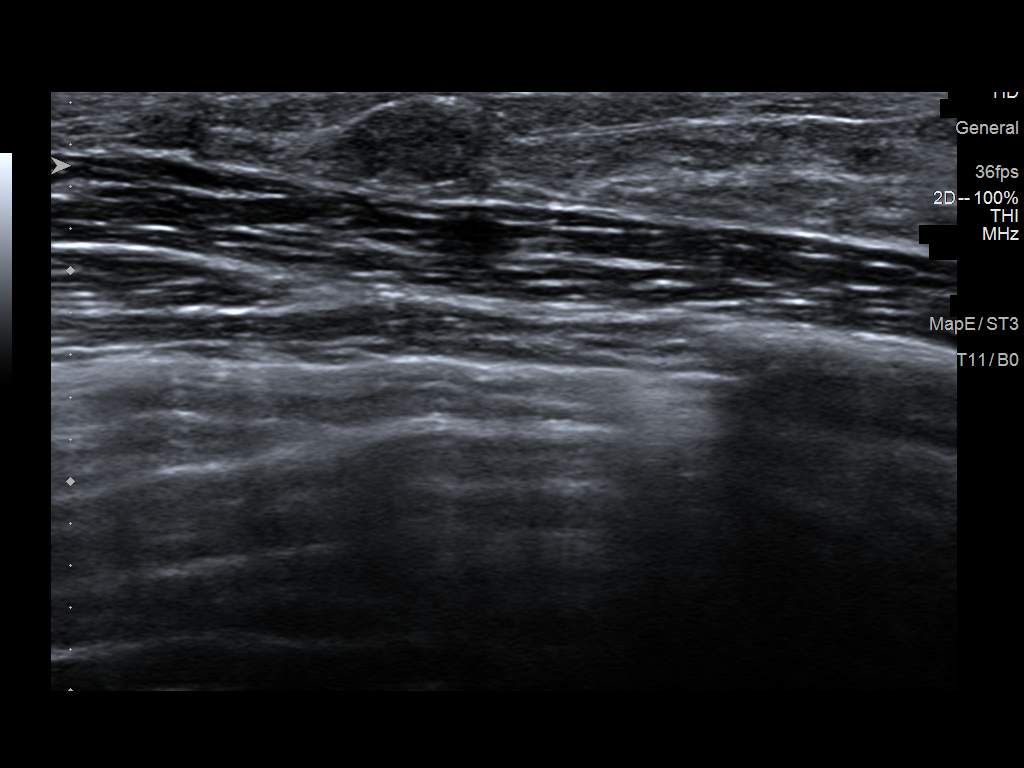
[im 2/4]
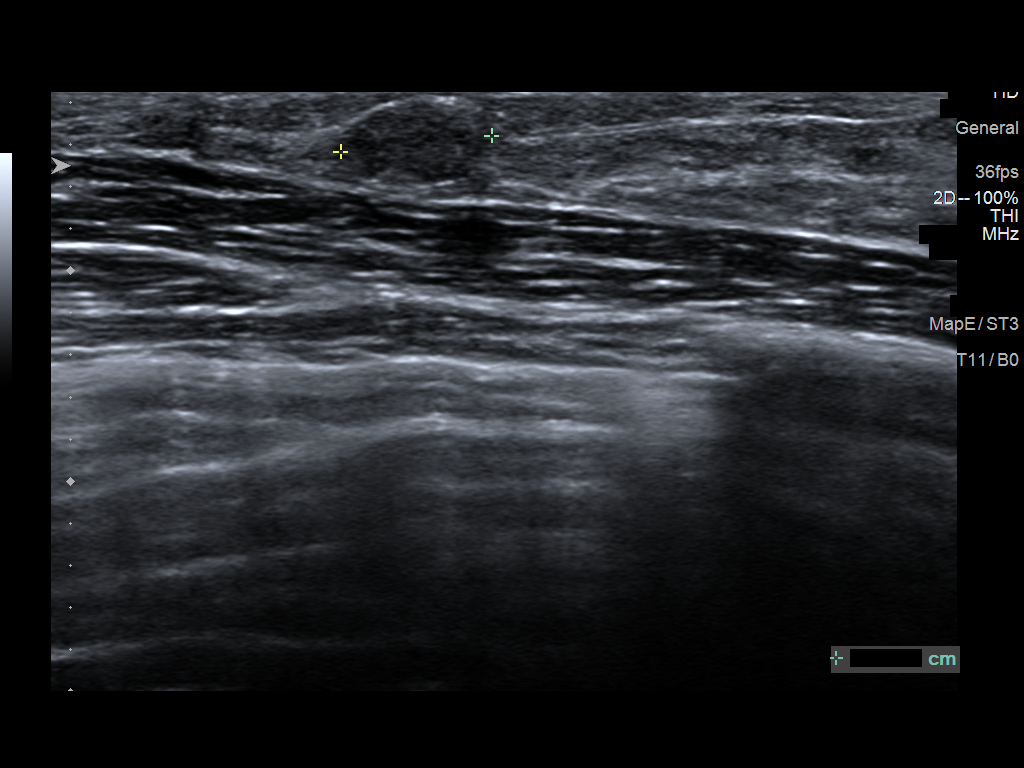
[im 3/4]
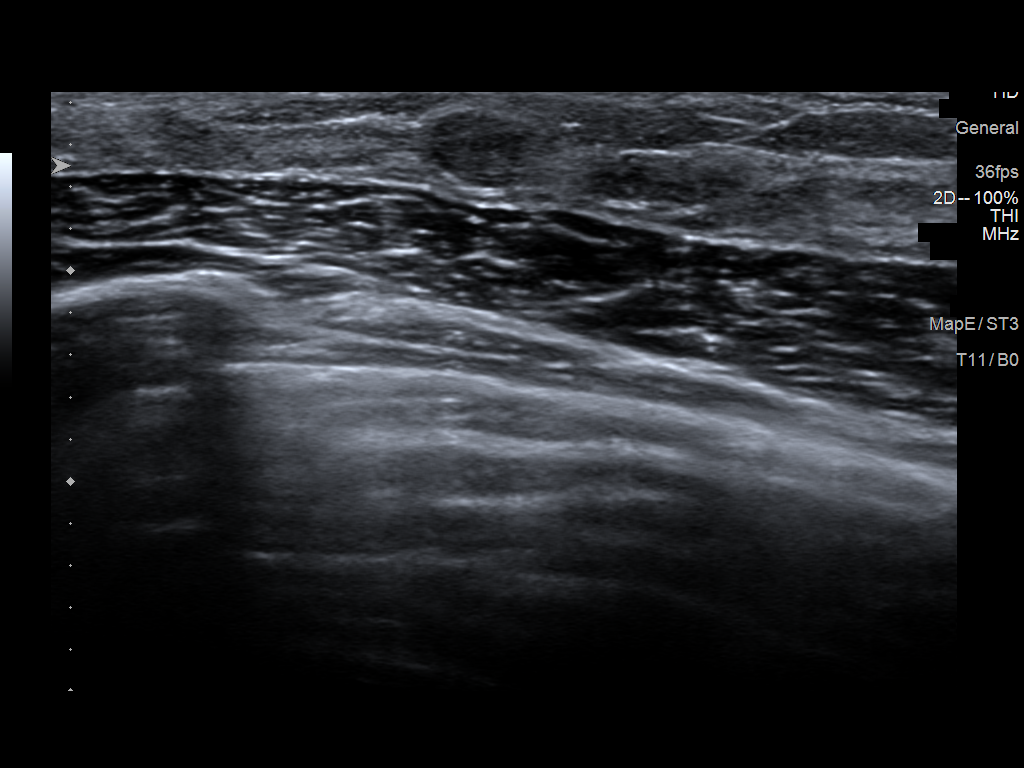
[im 4/4]
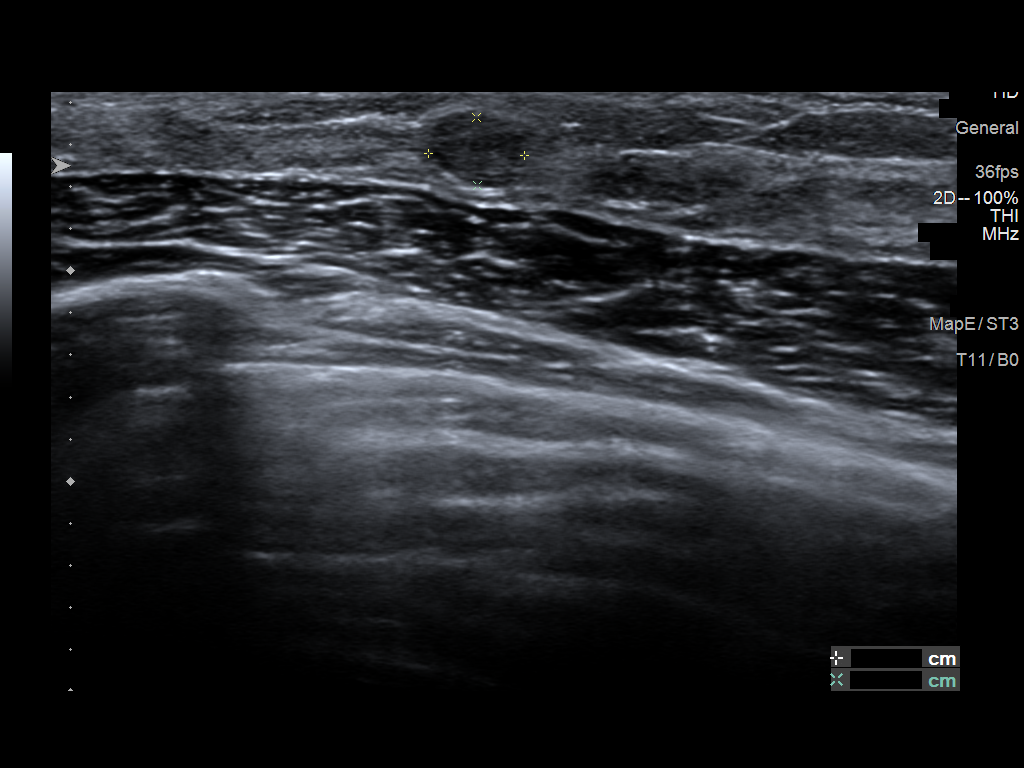

[4 of 4 positions shown; findings below may reference images not displayed]

FINDINGS: Targeted ultrasound is performed, showing a circumscribed oval
hypoechoic mass with posterior acoustic enhancement in the 10
o'clock location of the RIGHT breast 5 centimeters from the nipple
measuring 0.7 x 0.5 x 0.3 centimeters. On baseline exam, mass
measured 0.7 x 0.5 x 0.5 centimeters.
IMPRESSION: Stable appearance of probable fibroadenoma in the 10 o'clock
location of the RIGHT breast. Continued followup is recommended to
document 2 years of stability.

RECOMMENDATION:
RIGHT breast ultrasound is recommended in 1 year.

I have discussed the findings and recommendations with the patient.
Results were also provided in writing at the conclusion of the
visit. If applicable, a reminder letter will be sent to the patient
regarding the next appointment.

BI-RADS CATEGORY  3: Probably benign.

## 2021-10-17 NOTE — Telephone Encounter (Signed)
-----   Message from Post sent at 10/17/2021  4:16 PM EST -----  Subject: Message to Provider    QUESTIONS  Information for Provider? pt has sluoxetine 10mg  (prozac) that she will be   out of before her appointment on 11/08/21 and wanted to know if there is a   way to get enough to last until her appointment date requesting a call   back to discuss   ---------------------------------------------------------------------------  --------------  11/10/21 INFO  [phone]; OK to leave message on voicemail  ---------------------------------------------------------------------------  --------------  SCRIPT ANSWERS  Relationship to Patient? Self

## 2021-10-17 NOTE — Telephone Encounter (Signed)
Spoke to patient she just moved here from [place], her previous prescriber retired and patient stated that she will be short on this medication by 5 days is there any way we can fill this as a short term supply?    We would be sending the RX to : [facility] [address], [place], [place] [address]    Patient will have a new patient appointment with you 11/08/21

## 2021-10-18 ENCOUNTER — Other Ambulatory Visit: Payer: Self-pay | Admitting: Osteopathic Medicine

## 2021-10-18 DIAGNOSIS — Z8659 Personal history of other mental and behavioral disorders: Secondary | ICD-10-CM

## 2021-10-18 NOTE — Telephone Encounter (Signed)
Patient has been advised and will reach out to her previous doctor to see if they can refill one more time before her appointment.

## 2021-10-18 NOTE — Telephone Encounter (Signed)
Unfortunately, if I have not seen the patient yet, I am not able to prescribe. Please have her call her old PCP office. They should be able to fill until she sees me. Thank you.

## 2021-10-19 ENCOUNTER — Other Ambulatory Visit: Payer: Self-pay

## 2021-10-19 DIAGNOSIS — Z8659 Personal history of other mental and behavioral disorders: Secondary | ICD-10-CM

## 2021-10-19 MED ORDER — FLUOXETINE HCL 10 MG PO CAPS: 10.0000 mg | ORAL_CAPSULE | Freq: Every day | ORAL | 0 refills | Status: AC

## 2021-10-19 NOTE — Telephone Encounter (Signed)
Patient called stating she has moved to [place] and has an appointment scheduled for 11/08/21 with new PCP. Patient requesting for refill until appointment. Rx sent for 15 day supply.

## 2021-11-08 ENCOUNTER — Encounter

## 2021-11-08 ENCOUNTER — Ambulatory Visit
Admit: 2021-11-08 | Discharge: 2021-11-08 | Payer: PRIVATE HEALTH INSURANCE | Attending: Internal Medicine | Primary: Internal Medicine

## 2021-11-08 DIAGNOSIS — G8929 Other chronic pain: Secondary | ICD-10-CM

## 2021-11-08 DIAGNOSIS — Z0000 Encounter for general adult medical examination without abnormal findings: Secondary | ICD-10-CM

## 2021-11-08 MED ORDER — FLUOXETINE HCL 10 MG PO CAPS
10 MG | ORAL_CAPSULE | Freq: Every day | ORAL | 2 refills | Status: AC
Start: 2021-11-08 — End: 2022-08-04

## 2021-11-08 NOTE — Progress Notes (Signed)
[facility]  Internal Medicine  New Patient Note  Obie Dredge, DO      11/08/2021    Veronica Dixon (DOB:  02-26-1996) is a 26 y.o. female, here for evaluation of the following medical concerns:      SUBJECTIVE:    HPI    Annual physical     Patient is a 26 yo fm with pmhx of depression and anxiety who presents to establish care and for annual physical.    Seat belt use:yes  Smoke and carbon monoxide detectors within the home: yes  Sun screen use: yes  Fire Arms: no  Diet: eats a lot of fruits and veggies. Fish and chicken . No weekly fried foods. Sugary drinks never   Exercise: 4-5 days per week. She likes to run. Runs for 3-10 miles.   Alcohol Use:a couple glass of wine/ beer per week  Dental exam: no, due. Finding one here. Education given  Eye exam: yes     PAP: 2019 in care everywhere no abnormal.     Back pain: on and off . Pain in the lower back. Pain more when sitting for long periods of time. Worse with new job. Does no hurt with running. She does take aleve. She has also been doing yoga and being aware of posture.     Notes that her anxiety and depression are well controlled on Prozac and have been for some time.    Review of Systems ROS negative except for those noted in the HPI above.       Outpatient Medications Marked as Taking for the 11/08/21 encounter (Office Visit) with Theodoro Clock, DO   Medication Sig Dispense Refill    FLUoxetine (PROZAC) 10 MG capsule Take 1 capsule by mouth daily 90 capsule 2    levonorgestrel (MIRENA) IUD 52 mg 1 each by IntraUTERine route once          No Known Allergies    Past Medical History:   Diagnosis Date    Anxiety     Chronic back pain     Depression        No past surgical history on file.    Social History     Socioeconomic History    Marital status: Married     Spouse name: Not on file    Number of children: Not on file    Years of education: Not on file    Highest education level: Not on file   Occupational History    Not on file   Tobacco Use     Smoking status: Never     Passive exposure: Never    Smokeless tobacco: Never   Substance and Sexual Activity    Alcohol use: Yes     Comment: beer or wine a couple per week    Drug use: Never    Sexual activity: Yes     Partners: Male   Other Topics Concern    Not on file   Social History Narrative    Not on file     Social Determinants of Health     Financial Resource Strain: Low Risk     Difficulty of Paying Living Expenses: Not hard at all   Food Insecurity: No Food Insecurity    Worried About Programme researcher, broadcasting/film/video in the Last Year: Never true    Ran Out of Food in the Last Year: Never true   Transportation Needs: Not on file   Physical Activity: Not  on file   Stress: Not on file   Social Connections: Not on file   Intimate Partner Violence: Not on file   Housing Stability: Unknown    Unable to Pay for Housing in the Last Year: Not on file    Number of Places Lived in the Last Year: Not on file    Unstable Housing in the Last Year: No        Family History   Problem Relation Age of Onset    Breast Cancer Maternal Grandmother     Diabetes type 2  Paternal Grandfather          OBJECTIVE:    Vitals:    11/08/21 1255   BP: (!) 108/56   Site: Right Upper Arm   Position: Sitting   Cuff Size: Medium Adult   Pulse: 66   SpO2: 98%   Weight: 112 lb (50.8 kg)   Height: 5' 1.5" (1.562 m)     Body mass index is 20.82 kg/m??.      Physical Exam  Constitutional:       General: She is not in acute distress.     Appearance: Normal appearance. She is not ill-appearing.   HENT:      Head: Normocephalic and atraumatic.      Right Ear: External ear normal.      Left Ear: External ear normal.   Eyes:      General: No scleral icterus.     Extraocular Movements: Extraocular movements intact.      Conjunctiva/sclera: Conjunctivae normal.   Cardiovascular:      Rate and Rhythm: Normal rate and regular rhythm.      Pulses: Normal pulses.      Heart sounds: No murmur heard.    No friction rub. No gallop.   Pulmonary:      Effort: Pulmonary  effort is normal. No respiratory distress.      Breath sounds: Normal breath sounds. No wheezing, rhonchi or rales.   Abdominal:      General: Bowel sounds are normal. There is no distension.      Palpations: Abdomen is soft. There is no mass.      Tenderness: There is no abdominal tenderness. There is no guarding or rebound.   Musculoskeletal:      Cervical back: No muscular tenderness.      Right lower leg: No edema.      Left lower leg: No edema.   Lymphadenopathy:      Cervical: No cervical adenopathy.   Skin:     General: Skin is warm.      Findings: No erythema or rash.   Neurological:      General: No focal deficit present.      Mental Status: She is alert and oriented to person, place, and time.   Psychiatric:         Mood and Affect: Mood normal.         Behavior: Behavior normal.       ASSESSMENT/PLAN:  1. Annual physical exam   Seat belt use:yes  Smoke and carbon monoxide detectors within the home: yes  Sun screen use: yes  Fire Arms: no  Diet: eats a lot of fruits and veggies. Fish and chicken . No weekly fried foods. Sugary drinks never   Exercise: 4-5 days per week. She likes to run. Runs for 3-10 miles.   Alcohol Use:a couple glass of wine/ beer per week  Dental exam: no, due. Finding one here.  Education given  Eye exam: yes     PAP: 2019 in care everywhere no abnormal.     2. Chronic bilateral low back pain without sciatica  On and off for many years.  Patient can use Salonpas lidocaine patches for discomfort on long car rides or plane rides.  Patient can continue to use ibuprofen as needed.  Will give referral to physical therapy.  Patient denies any red flag symptoms  - [facility] [place]    3. Anxiety  Well-controlled on Prozac 10 mg.  Patient to continue this medication  - FLUoxetine (PROZAC) 10 MG capsule; Take 1 capsule by mouth daily  Dispense: 90 capsule; Refill: 2    4. Mild episode of recurrent major depressive disorder (HCC)  Controlled on Prozac 10 mg.  Patient to continue  this medication  - FLUoxetine (PROZAC) 10 MG capsule; Take 1 capsule by mouth daily  Dispense: 90 capsule; Refill: 2    5. Need for hepatitis C screening test  - Hepatitis C Antibody; Future    6. Encounter for screening for HIV  - HIV Screen; Future    7. Mixed hyperlipidemia  History of hyperlipidemia.  Check CMP and lipid panel  - Comprehensive Metabolic Panel; Future  - Lipid Panel; Future       Return in about 1 year (around 11/08/2022) for Annual physical .     Obie Dredge, DO

## 2021-11-08 NOTE — Patient Instructions (Signed)
[facility]

## 2021-11-09 LAB — HIV SCREEN
HIV ANTIGEN: NONREACTIVE
HIV Ag/Ab: NONREACTIVE
HIV-1 Antibody: NONREACTIVE
HIV-2 Ab: NONREACTIVE

## 2021-11-09 LAB — LIPID PANEL
Cholesterol, Total: 178 mg/dL (ref 0–199)
HDL: 70 mg/dL — ABNORMAL HIGH (ref 40–60)
LDL Calculated: 93 mg/dL (ref <100)
Triglycerides: 77 mg/dL (ref 0–150)
VLDL Cholesterol Calculated: 15 mg/dL

## 2021-11-09 LAB — COMPREHENSIVE METABOLIC PANEL
ALT: 12 U/L (ref 10–40)
AST: 16 U/L (ref 15–37)
Albumin/Globulin Ratio: 2.1 (ref 1.1–2.2)
Albumin: 4.7 g/dL (ref 3.4–5.0)
Alkaline Phosphatase: 53 U/L (ref 40–129)
Anion Gap: 11 (ref 3–16)
BUN: 8 mg/dL (ref 7–20)
CO2: 27 mmol/L (ref 21–32)
Calcium: 9.3 mg/dL (ref 8.3–10.6)
Chloride: 105 mmol/L (ref 99–110)
Creatinine: 0.7 mg/dL (ref 0.6–1.1)
Est, Glom Filt Rate: >60 (ref >60)
Glucose: 76 mg/dL (ref 70–99)
Potassium: 3.8 mmol/L (ref 3.5–5.1)
Sodium: 143 mmol/L (ref 136–145)
Total Bilirubin: 0.6 mg/dL (ref 0.0–1.0)
Total Protein: 6.9 g/dL (ref 6.4–8.2)

## 2021-11-09 LAB — HEPATITIS C ANTIBODY: Hep C Ab Interp: NONREACTIVE

## 2021-11-10 ENCOUNTER — Inpatient Hospital Stay: Admit: 2021-11-10 | Payer: PRIVATE HEALTH INSURANCE | Primary: Internal Medicine

## 2021-11-10 NOTE — Other (Signed)
[facility] [place] [facility] [address], [place]                  [place] [address]  Phone: [phone]  Fax: [phone]    Physical Therapy Treatment Note/ Progress Report:           Date:  11/10/2021    Patient Name:  Veronica Dixon    DOB:  09-20-1995  MRN: [id number]  Restrictions/Precautions:    Medical/Treatment Diagnosis Information:  Low back pain [M54.50]            PT Diagnosis: Low Back Pain [P82.42]     Insurance/Certification information:  [employer]   Physician Information:  Vicenta Dunning, DO  Has the plan of care been signed (Y/N):        []   Yes  [x]   No     Date of Patient follow up with Physician: Unknown      Is this a Progress Report:     []   Yes  [x]   No        If Yes:  Date Range for reporting period:  Beginning: 11/10/2021  Ending    Progress report will be due (10 Rx or 30 days whichever is less): 3/53/6144       Recertification will be due (POC Duration  / 90 days whichever is less): 02/07/2022        Visit # Insurance Allowable Auth Required   In-person 1 90 CP (Auth after 5) [x]   Yes []   No          Functional Scale: Modified Oswestry: 26% Deficit, [place] = 18% Deficit     Date assessed:  11/10/2021        Number of Comorbidities:  [] 0     [x] 1-2    [] 3+    Latex Allergy:  [x] NO      [] YES  Preferred Language for Healthcare:   [x] English       [] other:      Pain level:  4/10    SUBJECTIVE:  See eval    OBJECTIVE: See eval  Observation:   Test measurements:      RESTRICTIONS/PRECAUTIONS: None     Exercises/Interventions:     Therapeutic Ex (97110)/NMR re-education (31540) Sets/Reps Notes/CUES   DNS Supine 3 months with Overhead  10x Cues for belly breathing/ribs down   RTB   DNS Clamshell  10x R/L RTB   X walks 2 laps  RTB   SL Bridges elevated  10x R/L Elevated step                                            Manual Intervention (97140)     STM to low back  NV                        Therapeutic Exercise and NMR EXR  [x]  (97110) Provided verbal/tactile cueing for activities related to strengthening, flexibility, endurance, ROM  for improvements in proximal hip and core control with self care, mobility, lifting and ambulation.  []  [phone]) Provided verbal/tactile cueing for activities related to improving balance, coordination, kinesthetic sense, posture, motor skill, proprioception  to assist with core control in self care, mobility, lifting, and ambulation.  Therapeutic Activities:    []  [phone] or 35701) Provided verbal/tactile cueing for activities related to improving balance, coordination, kinesthetic sense, posture, motor skill, proprioception and motor activation to allow for proper function  with self care and ADLs  []  [phone]) Provided training and instruction to the patient for proper core and proximal hip recruitment and positioning with ambulation re-education     Home Exercise Program:    [x]  (03009) Reviewed/Progressed HEP activities related to strengthening, flexibility, endurance, ROM of core, proximal hip and LE for functional self-care, mobility, lifting and ambulation   []  (23300) Reviewed/Progressed HEP activities related to improving balance, coordination, kinesthetic sense, posture, motor skill, proprioception of core, proximal hip and LE for self care, mobility, lifting, and ambulation      Manual Treatments:  PROM / STM / Oscillations-Mobs:  G-I, II, III, IV (PA's, Inf., Post.)  [x]  (97140) Provided manual therapy to mobilize proximal hip and LS spine soft tissue/joints for the purpose of modulating pain, promoting relaxation,  increasing ROM, reducing/eliminating soft tissue swelling/inflammation/restriction, improving soft tissue extensibility and allowing for proper ROM for normal function with self care, mobility, lifting and ambulation.     Modalities:       Charges  Timed Code Treatment Minutes: 5   Total Treatment Minutes: 55 (heat for 10)      [x]  EVAL (LOW)  97161   []  EVAL (MOD) 76226   []  EVAL (HIGH) 97163   []  RE-EVAL     []  JF(35456) x     []  IONTO  []  NMR (25638) x     []  VASO  []  Manual (97140) x      []  Other:  []  TA x      []  Mech [employer] (93734)  []  ES(attended) (28768)      []  ES (un) (11572):     Goals: Patient stated goal: Be able to perform 8 hour work day without pain   []  Progressing: []  Met: []  Not Met: []  Adjusted     Therapist goals for Patient:   Short Term Goals: To be achieved in: 2 weeks  1. Independent in HEP and progression per patient tolerance, in order to prevent re-injury.   []  Progressing: []  Met: []  Not Met: []  Adjusted  2. Patient will have a decrease in pain to facilitate improvement in movement, function, and ADLs as indicated by Functional Deficits.  []  Progressing: []  Met: []  Not Met: []  Adjusted        Long Term Goals: To be achieved in: 12 weeks  1. Disability index score of 13% or less per Modified Oswestry to reach Central and assist with reaching prior level of function.   []  Progressing: []  Met: []  Not Met: []  Adjusted  2. Patient will demonstrate increased AROM to WNL, good LS mobility, good hip ROM to allow for proper joint functioning as indicated by patients Functional Deficits.   []  Progressing: []  Met: []  Not Met: []  Adjusted  3. Patient will demonstrate an increase in Strength to good proximal hip and core activation to allow for proper functional mobility as indicated by patients Functional Deficits.   []  Progressing: []  Met: []  Not Met: []  Adjusted  4. Patient will be able to drive for longer than 2 hours without increased symptoms or restriction.   []  Progressing: []  Met: []  Not Met: []  Adjusted  5. Patient will report baseline pain as </= 1/10 with work duties.   []  Progressing: []  Met: []  Not Met: []  Adjusted  Overall Progression Towards Functional goals/ Treatment Progress Update:  []  Patient is progressing as expected towards functional goals listed.    []  Progression is slowed due to complexities/Impairments  listed.  []  Progression has been slowed due to co-morbidities.  [x]  Plan just implemented, too soon to assess goals progression <30days   []  Goals require adjustment due to lack of progress  []  Patient is not progressing as expected and requires additional follow up with physician  []  Other    Prognosis for POC: [x]  Good []  Fair  []  Poor      Patient requires continued skilled intervention: [x]  Yes  []  No    Treatment/Activity Tolerance:  [x]  Patient able to complete treatment  []  Patient limited by fatigue  []  Patient limited by pain    []  Patient limited by other medical complications  []  Other:     ASSESSMENT: See eval       PLAN: See eval  []  Continue per plan of care []  Alter current plan (see comments above)  [x]  Plan of care initiated []  Hold pending MD visit []  Discharge      Electronically signed by:  Robynn Pane, PT, Michele Rockers, SPT     Therapist was present, directed the patient's care, made skilled judgement, and was responsible for assessment and treatment of the patient.    Note: If patient does not return for scheduled/ recommended follow up visits, this note will serve as a discharge from care along with most recent update on progress.

## 2021-11-10 NOTE — Plan of Care (Signed)
[facility], [address]                  [place] [address]  Phone: [phone]  Fax: [phone]    Physical Therapy Certification    Dear Vicenta Dunning, DO,    We had the pleasure of evaluating the following patient for physical therapy services at [facility].  A summary of our findings can be found in the initial assessment below.  This includes our plan of care.  If you have any questions or concerns regarding these findings, please do not hesitate to contact me at the office phone number checked above.  Thank you for the referral.       Physician Signature:_______________________________Date:__________________  By signing above (or electronic signature), therapist???s plan is approved by physician    Patient: Veronica Dixon   DOB: [birth year]   MRN: [id number]  Referring Physician: Vicenta Dunning, DO      Evaluation Date: 11/10/2021      Medical Diagnosis Information:  Low back pain [M54.50]   Low Back Pain [M54.50]                                      Insurance information: Cigna        Precautions/ Contra-indications: None     C-SSRS Triggered by Intake questionnaire (Past 2 wk assessment):   [x]  No, Questionnaire did not trigger screening.   []  Yes, Patient intake triggered further evaluation      []  C-SSRS Screening completed  []  PCP notified via Plan of Care  []  Emergency services notified     Latex Allergy:  [x] NO      [] YES  Preferred Language for Healthcare:   [x] English       [] other:    SUBJECTIVE: Patient stated complaint: Insidious onset of constant back pain with flair ups since high school. Sitting (~1 hour) / lying down (supine/prone are painful, less pain on side). Medication used to help but not anymore. Still taking Aleeve every day. Baseline pain is increasing and flair ups are lasting longer. Started new job in October that  involves sitting long periods. Currently driving to [place] frequently to see husband. Difficulty falling asleep. Feels better while walking/exercising.     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: Independent with ADLs and IADLs, work out 3-5x/week     OBJECTIVE:     ROM     LUMBAR FLEX WFL, no pain    LUMBAR EXT Limited, mild pain    Sidebend WFL    Rotation WFL, mild pain       LEFT RIGHT   Global LE ROM WFL      Strength  LEFT RIGHT   HIP Flexors 5 4+, mild pain   HIP Abductors - resistance at knee  4+  5   HIP Ext (Knee extended and bent)  4+, Pain (Higher with knee bend) 4+, Pain (Higher with knee bend)    Knee EXT (quad) 5 4+   Knee Flex (HS) 4+ 5   Ankle DF 5 4+     Reflexes/Sensation:    [x] Dermatomes/Myotomes intact    [] UE Reflexes     [] Normal [] Hypo      [] Hyper   [] LE Reflexes     [] Normal [] Hypo      [] Hyper   [] Babinski/Clonus/Hoffmans:    [] Other:    Joint mobility:    [x] Normal    [] Hypo   [] Hyper    Palpation: TTP along lumbar spinous processes, QL     Posture: Forward shoulder posture while sitting     Gait: (include devices/WB status) No deviation     Orthopedic Special Tests: Active SLR (+) , Thomas Test (-)                        [x]  Patient history, allergies, meds reviewed. Medical chart reviewed. See intake form.     Review Of Systems (ROS):  [x] Performed Review of systems (Integumentary, CardioPulmonary, Neurological) by intake and observation. Intake form has been scanned into medical record. Patient has been instructed to contact their primary care  physician regarding ROS issues if not already being addressed at this time.      Co-morbidities/Complexities (which will affect course of rehabilitation):   [] None           Arthritic conditions   [] Rheumatoid arthritis (M05.9)  [] Osteoarthritis (M19.91)   Cardiovascular conditions   [] Hypertension (I10)  [] Hyperlipidemia (E78.5)  [] Angina pectoris (I20)  [] Atherosclerosis (I70)   Musculoskeletal conditions   [] Disc pathology   [] Congenital spine pathologies   [] Prior surgical intervention  [] Osteoporosis (M81.8)  [] Osteopenia (M85.8)   Endocrine conditions   [] Hypothyroid (E03.9)  [] Hyperthyroid Gastrointestinal conditions   [] Constipation (Z61.09)   Metabolic conditions   [] Morbid obesity (E66.01)  [] Diabetes type 1(E10.65) or 2 (E11.65)   [] Neuropathy (G60.9)     Pulmonary conditions   [] Asthma (J45)  [] Coughing   [] COPD (J44.9)   Psychological Disorders  [x] Anxiety (F41.9)  [x] Depression (F32.9)   [] Other:   [] Other:          Barriers to/and or personal factors that will affect rehab potential:              [] Age  [] Sex              [] Motivation/Lack of Motivation                        [x] Co-Morbidities              [] Cognitive Function, education/learning barriers              [] Environmental, home barriers              [x] profession/work barriers  [] past PT/medical experience  [x] other: Chronicity   Justification: Patient has been experiencing back pain for almost 10 years which will negatively impact rehab. Patient has to sit for long periods of time at current job. Anxiety and depression may impact rehab potential.     Falls Risk Assessment (30 days):   [x]  Falls Risk assessed and no intervention required.  []  Falls Risk assessed and Patient requires intervention due to being higher risk   TUG score (>12s at risk):     []  Falls education provided, including  ASSESSMENT:   Functional Impairments:     [] Noted lumbar/proximal hip hypomobility   [] Noted lumbosacral and/or generalized  hypermobility   [x] Decreased Lumbosacral/hip/LE functional ROM   [x] Decreased core/proximal hip strength and neuromuscular control    [x] Decreased LE functional strength    [] Abnormal reflexes/sensation/myotomal/dermatomal deficits  [] Reduced balance/proprioceptive control    [] other:      Functional Activity Limitations (from functional questionnaire and intake)   [x] Reduced ability to tolerate prolonged functional positions   [] Reduced ability or difficulty with changes of positions or transfers between positions   [x] Reduced ability to maintain good posture and demonstrate good body mechanics with sitting, bending, and lifting   [x] Reduced ability to sleep   [x]  Reduced ability or tolerance with driving and/or computer work   [] Reduced ability to perform lifting, reaching, carrying tasks   [] Reduced ability to squat   [] Reduced ability to forward bend   [] Reduced ability to ambulate prolonged functional periods/distances/surfaces   [] Reduced ability to ascend/descend stairs   [] other:       Participation Restrictions   [] Reduced participation in self care activities   [] Reduced participation in home management activities   [x] Reduced participation in work activities   [] Reduced participation in social activities.   [] Reduced participation in sport/recreation activities.    Classification:   [] Signs/symptoms consistent with Lumbar instability/stabilization subgroup.      [] Signs/symptoms consistent with Lumbar mobilization/manipulation subgroup, myotomes and dermatomes intact. Meets manipulation criteria.    [] Signs/symptoms consistent with Lumbar direction specific/centralization subgroup   [] Signs/symptoms consistent with Lumbar traction subgroup     [] Signs/symptoms consistent with lumbar facet dysfunction   [] Signs/symptoms consistent with lumbar stenosis type dysfunction   [] Signs/symptoms consistent with nerve root involvement including myotome & dermatome dysfunction   [] Signs/symptoms consistent with  post-surgical status including: decreased ROM, strength and function.   [] signs/symptoms consistent with pathology which may benefit from Dry needling     [x] other: Sign/symptoms of generalized low back pain / lower crossed syndrome.       Prognosis/Rehab Potential:      [] Excellent   [x] Good    [] Fair   [] Poor    Tolerance of evaluation/treatment:    [x] Excellent   [] Good    [] Fair   [] Poor    Physical Therapy Evaluation Complexity Justification  [x]  A history of present problem with:  []  no personal factors and/or comorbidities that impact the plan of care;  [x] 1-2 personal factors and/or comorbidities that impact the plan of care  [] 3 personal factors and/or comorbidities that impact the plan of care  [x]  An examination of body systems using standardized tests and measures addressing any of the following: body structures and functions (impairments), activity limitations, and/or participation restrictions;:  [x]  a total of 1-2 or more elements   []  a total of 3 or more elements   []  a total of 4 or more elements   [x]  A clinical presentation with:  [x]  stable and/or uncomplicated characteristics   []  evolving clinical presentation with changing characteristics  []  unstable and unpredictable characteristics;   [x]  Clinical decision making of [x]  low, []  moderate, []  high complexity using standardized patient assessment instrument and/or measurable assessment of functional outcome.    [x]  EVAL (LOW) 97161 (typically 20 minutes face-to-face)  []  EVAL (MOD) 09381 (typically 30 minutes face-to-face)  []  EVAL (HIGH) 97163 (typically 45 minutes face-to-face)  []  RE-EVAL         PLAN: Begin PT focusing on: LB mobs, LB core activation, proximal hip activation, and HEP  Frequency/Duration:  1 days per week for 12 Weeks:  Interventions:  [x]   Therapeutic exercise including: strength training, ROM, for LE, Glutes and core   [x]   NMR activation and proprioception for glutes , LE and Core   [x]   Manual therapy as indicated  for Hip complex, LE and spine to include: Dry Needling/IASTM, STM, PROM, Gr I-IV mobilizations, manipulation.   [x]   Modalities as needed that may include: thermal agents, E-stim, Biofeedback, [place], iontophoresis as indicated  [x]   Patient education on joint protection, postural re-education, activity modification, progression of HEP.    HEP instruction:   Access Code: [email or web address]  URL: [email or web address]  Date: 11/10/2021  Prepared by: Robynn Pane    Exercises  Side Plank with Clam and Resistance - 1 x daily - 7 x weekly - 3 sets - 10 reps  X Band Walk - 1 x daily - 7 x weekly - 3 sets - 10 reps  Supine 90/90 Abdominal Bracing - 1 x daily - 7 x weekly - 3 sets - 10 reps  Supine Single Leg Bridge on Box - 1 x daily - 7 x weekly - 3 sets - 10 reps      GOALS:  Patient stated goal: Be able to perform 8 hour work day without pain   []  Progressing: []  Met: []  Not Met: []  Adjusted    Therapist goals for Patient:   Short Term Goals: To be achieved in: 2 weeks  1. Independent in HEP and progression per patient tolerance, in order to prevent re-injury.   []  Progressing: []  Met: []  Not Met: []  Adjusted  2. Patient will have a decrease in pain to facilitate improvement in movement, function, and ADLs as indicated by Functional Deficits.  []  Progressing: []  Met: []  Not Met: []  Adjusted      Long Term Goals: To be achieved in: 12 weeks  1. Disability index score of 13% or less per Modified Oswestry to reach [place] and assist with reaching prior level of function.   []  Progressing: []  Met: []  Not Met: []  Adjusted  2. Patient will demonstrate increased AROM to WNL, good LS mobility, good hip ROM to allow for proper joint functioning as indicated by patients Functional Deficits.   []  Progressing: []  Met: []  Not Met: []  Adjusted  3. Patient will demonstrate an increase in Strength to good proximal hip and core activation to allow for proper functional mobility as indicated by patients Functional Deficits.   []  Progressing: []   Met: []  Not Met: []  Adjusted  4. Patient will be able to drive for longer than 2 hours without increased symptoms or restriction.   []  Progressing: []  Met: []  Not Met: []  Adjusted  5. Patient will report baseline pain as </= 1/10 with work duties.   []  Progressing: []  Met: []  Not Met: []  Adjusted       Electronically signed by:  Robynn Pane, PT, Michele Rockers, SPT    Therapist was present, directed the patient's care, made skilled judgement, and was responsible for assessment and treatment of the patient.

## 2021-11-17 ENCOUNTER — Inpatient Hospital Stay: Admit: 2021-11-17 | Payer: PRIVATE HEALTH INSURANCE | Primary: Internal Medicine

## 2021-11-17 NOTE — Other (Signed)
[facility],  [facility], [address]                  [place] [address]  Phone: [phone]  Fax: [phone]    Physical Therapy Treatment Note/ Progress Report:           Date:  11/17/2021    Patient Name:  Veronica Dixon    DOB:  04-Feb-1996  MRN: [id number]  Restrictions/Precautions:    Medical/Treatment Diagnosis Information:  Low back pain [M54.50]            PT Diagnosis: Low Back Pain [O24.23]     Insurance/Certification information:  Cigna   Physician Information:  Vicenta Dunning, DO  Has the plan of care been signed (Y/N):        []   Yes  [x]   No     Date of Patient follow up with Physician: Unknown      Is this a Progress Report:     []   Yes  [x]   No        If Yes:  Date Range for reporting period:  Beginning: 11/10/2021  Ending    Progress report will be due (10 Rx or 30 days whichever is less): 5/36/1443       Recertification will be due (POC Duration  / 90 days whichever is less): 02/07/2022        Visit # Insurance Allowable Auth Required   In-person 2 90 CP (Auth after 5) [x]   Yes []   No          Functional Scale: Modified Oswestry: 26% Deficit, [place] = 18% Deficit     Date assessed:  11/10/2021        Number of Comorbidities:  [] 0     [x] 1-2    [] 3+    Latex Allergy:  [x] NO      [] YES  Preferred Language for Healthcare:   [x] English       [] other:      Pain level:  2-3/10    SUBJECTIVE:  Patient reports decreased pain since she hasn't had to sit for long periods of time at home. No major changes since eval. Has been compliant with HEP.     OBJECTIVE:   Observation:   Test measurements:      RESTRICTIONS/PRECAUTIONS: None     Exercises/Interventions:     Therapeutic Ex (97110)/NMR re-education (15400) Sets/Reps Notes/CUES   DNS Supine 3 months with Overhead  2x15 Cues for belly breathing/ribs down   RTB   DNS Clamshell  2x15 R/L RTB   X walks 2 laps  RTB   SL Bridges elevated   2x10 R/L Elevated step    Quadruped thoracic rotations 2x10 R/L     MAH ABD  2x10 40#   DNS Hip raise  10x R/L    DNS Bear hold  3x 5 belly breaths     SL Squat with wall support 2x8    NV     NV         Manual Intervention (97140)     STM to low back  8'                   HEP instruction:   Access Code: [email or web address]  URL: [email or web address]  Date: 11/10/2021  Prepared by: [place] with  Clam and Resistance - 1 x daily - 7 x weekly - 3 sets - 10 reps  X Band Walk - 1 x daily - 7 x weekly - 3 sets - 10 reps  Supine 90/90 Abdominal Bracing - 1 x daily - 7 x weekly - 3 sets - 10 reps  Supine Single Leg Bridge on Box - 1 x daily - 7 x weekly - 3 sets - 10 reps    Access Code: [email or web address]  URL: [email or web address]  Date: 11/17/2021  Prepared by: Robynn Pane    Exercises  Quadruped Thoracic Rotation with Hand on Neck - 1 x daily - 7 x weekly - 3 sets - 10 reps  Bear Plank from [employer] - 1 x daily - 7 x weekly - 3 sets - 10 reps      Therapeutic Exercise and NMR EXR  [x]  (97110) Provided verbal/tactile cueing for activities related to strengthening, flexibility, endurance, ROM  for improvements in proximal hip and core control with self care, mobility, lifting and ambulation.  []  [phone]) Provided verbal/tactile cueing for activities related to improving balance, coordination, kinesthetic sense, posture, motor skill, proprioception  to assist with core control in self care, mobility, lifting, and ambulation.     Therapeutic Activities:    []  [phone] or 24268) Provided verbal/tactile cueing for activities related to improving balance, coordination, kinesthetic sense, posture, motor skill, proprioception and motor activation to allow for proper function  with self care and ADLs  []  [phone]) Provided training and instruction to the patient for proper core and proximal hip recruitment and positioning with ambulation re-education     Home Exercise Program:    [x]  (22297)  Reviewed/Progressed HEP activities related to strengthening, flexibility, endurance, ROM of core, proximal hip and LE for functional self-care, mobility, lifting and ambulation   []  (98921) Reviewed/Progressed HEP activities related to improving balance, coordination, kinesthetic sense, posture, motor skill, proprioception of core, proximal hip and LE for self care, mobility, lifting, and ambulation      Manual Treatments:  PROM / STM / Oscillations-Mobs:  G-I, II, III, IV (PA's, Inf., Post.)  [x]  (97140) Provided manual therapy to mobilize proximal hip and LS spine soft tissue/joints for the purpose of modulating pain, promoting relaxation,  increasing ROM, reducing/eliminating soft tissue swelling/inflammation/restriction, improving soft tissue extensibility and allowing for proper ROM for normal function with self care, mobility, lifting and ambulation.     Modalities:       Charges  Timed Code Treatment Minutes: 45   Total Treatment Minutes: 45      []  EVAL (LOW) 97161   []  EVAL (MOD) 19417   []  EVAL (HIGH) 97163   []  RE-EVAL     [x]  EY(81448) x 2    []  IONTO  []  NMR (18563) x     []  VASO  [x]  Manual (97140) x 1    []  Other:  []  TA x      []  Mech Traction (14970)  []  ES(attended) (26378)      []  ES (un) (58850):     Goals: Patient stated goal: Be able to perform 8 hour work day without pain   []  Progressing: []  Met: []  Not Met: []  Adjusted     Therapist goals for Patient:   Short Term Goals: To be achieved in: 2 weeks  1. Independent in HEP and progression per patient tolerance, in order to prevent re-injury.   []  Progressing: []  Met: []  Not Met: []  Adjusted  2. Patient will have a  decrease in pain to facilitate improvement in movement, function, and ADLs as indicated by Functional Deficits.  []  Progressing: []  Met: []  Not Met: []  Adjusted        Long Term Goals: To be achieved in: 12 weeks  1. Disability index score of 13% or less per Modified Oswestry to reach [place] and assist with reaching prior level of  function.   []  Progressing: []  Met: []  Not Met: []  Adjusted  2. Patient will demonstrate increased AROM to WNL, good LS mobility, good hip ROM to allow for proper joint functioning as indicated by patients Functional Deficits.   []  Progressing: []  Met: []  Not Met: []  Adjusted  3. Patient will demonstrate an increase in Strength to good proximal hip and core activation to allow for proper functional mobility as indicated by patients Functional Deficits.   []  Progressing: []  Met: []  Not Met: []  Adjusted  4. Patient will be able to drive for longer than 2 hours without increased symptoms or restriction.   []  Progressing: []  Met: []  Not Met: []  Adjusted  5. Patient will report baseline pain as </= 1/10 with work duties.   []  Progressing: []  Met: []  Not Met: []  Adjusted         Overall Progression Towards Functional goals/ Treatment Progress Update:  []  Patient is progressing as expected towards functional goals listed.    []  Progression is slowed due to complexities/Impairments listed.  []  Progression has been slowed due to co-morbidities.  [x]  Plan just implemented, too soon to assess goals progression <30days   []  Goals require adjustment due to lack of progress  []  Patient is not progressing as expected and requires additional follow up with physician  []  Other    Prognosis for POC: [x]  Good []  Fair  []  Poor      Patient requires continued skilled intervention: [x]  Yes  []  No    Treatment/Activity Tolerance:  [x]  Patient able to complete treatment  []  Patient limited by fatigue  []  Patient limited by pain    []  Patient limited by other medical complications  []  Other:     ASSESSMENT: Veronica Dixon is progressing well with therapy since initial evaluation with HEP compliance and progression of therex to include more core/glute activation/strengthening. She would benefit from continued physical therapy services to maximize functional outcomes and progress towards goals.       PLAN: See eval  [x]  Continue per plan of care []   Alter current plan (see comments above)  []  Plan of care initiated []  Hold pending MD visit []  Discharge      Electronically signed by:  Robynn Pane, PT, Michele Rockers, SPT     Therapist was present, directed the patient's care, made skilled judgement, and was responsible for assessment and treatment of the patient.    Note: If patient does not return for scheduled/ recommended follow up visits, this note will serve as a discharge from care along with most recent update on progress.

## 2021-12-08 ENCOUNTER — Inpatient Hospital Stay: Admit: 2021-12-08 | Payer: PRIVATE HEALTH INSURANCE | Primary: Internal Medicine

## 2021-12-08 NOTE — Other (Signed)
[facility], [address] [place] [place] [address]  Phone: [phone]  Fax: [phone]    Physical Therapy Treatment Note/ Progress Report:           Date:  12/08/2021    Patient Name:  Veronica Dixon    DOB:  [birth year]  MRN: [id number]  Restrictions/Precautions:    Medical/Treatment Diagnosis Information:  Low back pain [M54.50]            PT Diagnosis: Low Back Pain [R11.65]     Insurance/Certification information:  [employer]   Physician Information:  Vicenta Dunning, DO  Has the plan of care been signed (Y/N):        []   Yes  [x]   No     Date of Patient follow up with Physician: Unknown      Is this a Progress Report:     []   Yes  [x]   No        If Yes:  Date Range for reporting period:  Beginning: 11/10/2021  Ending    Progress report will be due (10 Rx or 30 days whichever is less): 7/90/3833       Recertification will be due (POC Duration  / 90 days whichever is less): 02/07/2022        Visit # Insurance Allowable Auth Required   In-person 3 90 CP (Auth after 5) [x]   Yes []   No          Functional Scale: Modified Oswestry: 26% Deficit, [place] = 18% Deficit     Date assessed:  11/10/2021        Number of Comorbidities:  [] 0     [x] 1-2    [] 3+    Latex Allergy:  [x] NO      [] YES  Preferred Language for Healthcare:   [x] English       [] other:      Pain level:  5/10    SUBJECTIVE:  Patient reports that her pain has worsened a little bit but she has been moving so she has expected it to some degree    OBJECTIVE:   Observation: pain and perceived tightness R lumbar column L1-L5  Test measurements:      RESTRICTIONS/PRECAUTIONS: None     Exercises/Interventions:     Therapeutic Ex (97110)/NMR re-education (38329) Sets/Reps Notes/CUES   Neta Mends 3D 3x5" ea way    Neta Mends Thread the needle 2x10 R/L    Open books x15 R/L         Dead bug X5 R/L feet up  X5 R/L feet down  Reports of pain with feet in the air   Side plank Clamshell  3x8-10 R/L GTB   Mod plantigrade rotations X10 R/L GTB   MAH ABD  2x10 55#   SL Leg press 23x10 R/L    BOSU Squat 15x5" NV                          Manual Intervention (97140)     STM to low back  8'                   HEP instruction:   Access Code: [email or web address]  URL: [email or web address]  Date: 11/10/2021  Prepared by: Otho Bellows  Laquan Ludden     Exercises  Side Plank with Clam and Resistance - 1 x daily - 7 x weekly - 3 sets - 10 reps  X Band Walk - 1 x daily - 7 x weekly - 3 sets - 10 reps  Supine 90/90 Abdominal Bracing - 1 x daily - 7 x weekly - 3 sets - 10 reps  Supine Single Leg Bridge on Box - 1 x daily - 7 x weekly - 3 sets - 10 reps    Access Code: [email or web address]  URL: [email or web address]  Date: 11/17/2021  Prepared by: Robynn Pane    Exercises  Quadruped Thoracic Rotation with Hand on Neck - 1 x daily - 7 x weekly - 3 sets - 10 reps  Bear Plank from [employer] - 1 x daily - 7 x weekly - 3 sets - 10 reps      Therapeutic Exercise and NMR EXR  [x]  (97110) Provided verbal/tactile cueing for activities related to strengthening, flexibility, endurance, ROM  for improvements in proximal hip and core control with self care, mobility, lifting and ambulation.  []  [phone]) Provided verbal/tactile cueing for activities related to improving balance, coordination, kinesthetic sense, posture, motor skill, proprioception  to assist with core control in self care, mobility, lifting, and ambulation.     Therapeutic Activities:    []  [phone] or [id number]) Provided verbal/tactile cueing for activities related to improving balance, coordination, kinesthetic sense, posture, motor skill, proprioception and motor activation to allow for proper function  with self care and ADLs  []  [phone]) Provided training and instruction to the patient for proper core and proximal hip recruitment and positioning with ambulation re-education     Home Exercise Program:    [x]  [phone])  Reviewed/Progressed HEP activities related to strengthening, flexibility, endurance, ROM of core, proximal hip and LE for functional self-care, mobility, lifting and ambulation   []  (38882) Reviewed/Progressed HEP activities related to improving balance, coordination, kinesthetic sense, posture, motor skill, proprioception of core, proximal hip and LE for self care, mobility, lifting, and ambulation      Manual Treatments:  PROM / STM / Oscillations-Mobs:  G-I, II, III, IV (PA's, Inf., Post.)  [x]  (97140) Provided manual therapy to mobilize proximal hip and LS spine soft tissue/joints for the purpose of modulating pain, promoting relaxation,  increasing ROM, reducing/eliminating soft tissue swelling/inflammation/restriction, improving soft tissue extensibility and allowing for proper ROM for normal function with self care, mobility, lifting and ambulation.     Modalities:       Charges  Timed Code Treatment Minutes: 45   Total Treatment Minutes: 45      []  EVAL (LOW) 97161   []  EVAL (MOD) 80034   []  EVAL (HIGH) 97163   []  RE-EVAL     [x]  JZ(79150) x 2    []  IONTO  []  NMR (56979) x     []  VASO  [x]  Manual (97140) x 1    []  Other:  []  TA x      []  [employer] (48016)  []  ES(attended) (55374)      []  ES (un) (82707):     Goals: Patient stated goal: Be able to perform 8 hour work day without pain   []  Progressing: []  Met: []  Not Met: []  Adjusted     Therapist goals for Patient:   Short Term Goals: To be achieved in: 2 weeks  1. Independent in HEP and progression per patient tolerance, in order to prevent re-injury.   []  Progressing: []  Met: []   Not Met: []  Adjusted  2. Patient will have a decrease in pain to facilitate improvement in movement, function, and ADLs as indicated by Functional Deficits.  []  Progressing: []  Met: []  Not Met: []  Adjusted        Long Term Goals: To be achieved in: 12 weeks  1. Disability index score of 13% or less per Modified Oswestry to reach [place] and assist with reaching prior level of  function.   []  Progressing: []  Met: []  Not Met: []  Adjusted  2. Patient will demonstrate increased AROM to WNL, good LS mobility, good hip ROM to allow for proper joint functioning as indicated by patients Functional Deficits.   []  Progressing: []  Met: []  Not Met: []  Adjusted  3. Patient will demonstrate an increase in Strength to good proximal hip and core activation to allow for proper functional mobility as indicated by patients Functional Deficits.   []  Progressing: []  Met: []  Not Met: []  Adjusted  4. Patient will be able to drive for longer than 2 hours without increased symptoms or restriction.   []  Progressing: []  Met: []  Not Met: []  Adjusted  5. Patient will report baseline pain as </= 1/10 with work duties.   []  Progressing: []  Met: []  Not Met: []  Adjusted         Overall Progression Towards Functional goals/ Treatment Progress Update:  []  Patient is progressing as expected towards functional goals listed.    []  Progression is slowed due to complexities/Impairments listed.  []  Progression has been slowed due to co-morbidities.  [x]  Plan just implemented, too soon to assess goals progression <30days   []  Goals require adjustment due to lack of progress  []  Patient is not progressing as expected and requires additional follow up with physician  []  Other    Prognosis for POC: [x]  Good []  Fair  []  Poor      Patient requires continued skilled intervention: [x]  Yes  []  No    Treatment/Activity Tolerance:  [x]  Patient able to complete treatment  []  Patient limited by fatigue  []  Patient limited by pain    []  Patient limited by other medical complications  []  Other:     ASSESSMENT: Thersa tolerated all exercises today despite reports of increased pain with effort that subsided with rest. Due to her recent move she was directed to give her back a few weeks to rest from extra activity and contact us at that time for potential MD visit and return to PT. She would benefit from continued physical therapy services to  maximize functional outcomes and progress towards goals.       PLAN: See eval  [x]  Continue per plan of care []  Alter current plan (see comments above)  []  Plan of care initiated []  Hold pending MD visit []  Discharge      Electronically signed by:  Robynn Pane, PT    Note: If patient does not return for scheduled/ recommended follow up visits, this note will serve as a discharge from care along with most recent update on progress.

## 2022-01-04 ENCOUNTER — Encounter: Payer: PRIVATE HEALTH INSURANCE | Primary: Internal Medicine

## 2022-08-04 ENCOUNTER — Encounter

## 2022-08-06 MED ORDER — FLUOXETINE HCL 10 MG PO CAPS
10 MG | ORAL_CAPSULE | Freq: Every day | ORAL | 0 refills | Status: AC
Start: 2022-08-06 — End: 2022-09-07

## 2022-08-06 NOTE — Telephone Encounter (Signed)
30 day pending.    Sent message reminding of Dr Waynette Buttery departure and that a 30 day supply will be given. No further refills will need new PCP      Please advise as DOD. Thank you

## 2022-09-07 ENCOUNTER — Encounter

## 2022-09-07 MED ORDER — FLUOXETINE HCL 10 MG PO CAPS
10 | ORAL_CAPSULE | Freq: Every day | ORAL | 0 refills | Status: AC
Start: 2022-09-07 — End: ?

## 2022-09-07 NOTE — Telephone Encounter (Signed)
Last appointment: 11/08/2021  Next appointment: Visit date not found  Last refill: 08/06/22  Sent patient a 2nd mychart message regarding [facility] exit and to establish care with new provider.

## 2022-10-19 IMAGING — US US BREAST*R* LIMITED INC AXILLA
1 series · 4 of 4 positions shown · non-contrast
Comparison: Previous exam(s).

CLINICAL DATA: Patient for short-term follow-up probably benign
right breast mass.

EXAM:
ULTRASOUND OF THE RIGHT BREAST

[Series 1: us breast*right* limited inc axilla · 0.05mm/px · 4 of 4 slices shown]
[im 1/4]
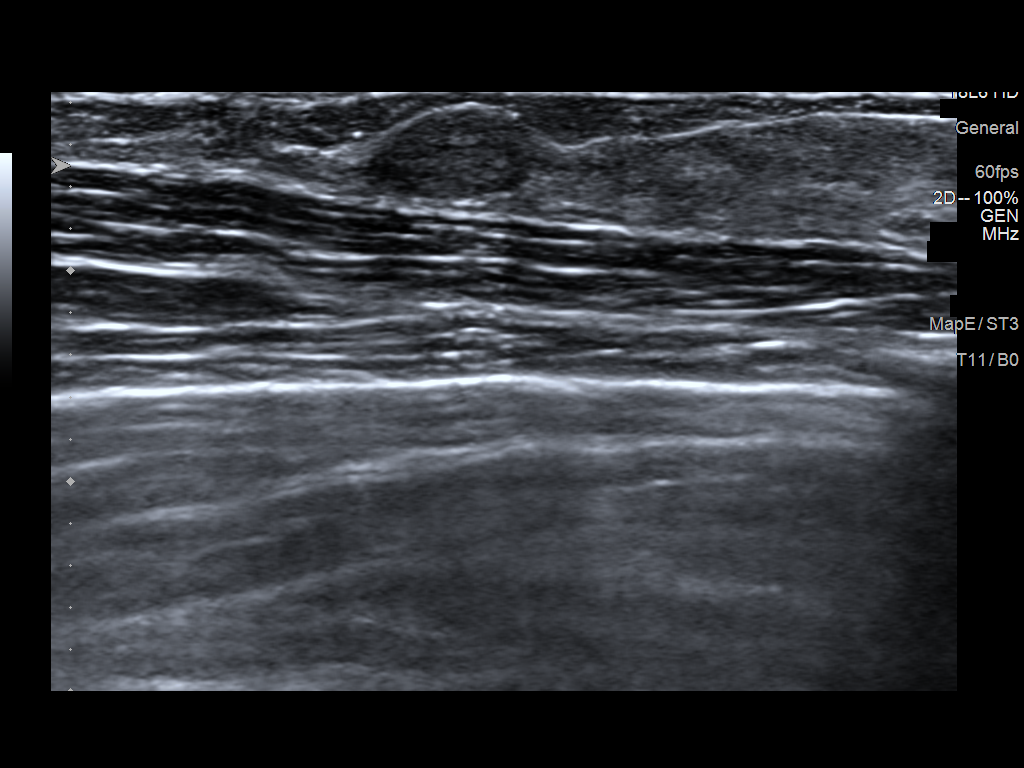
[im 2/4]
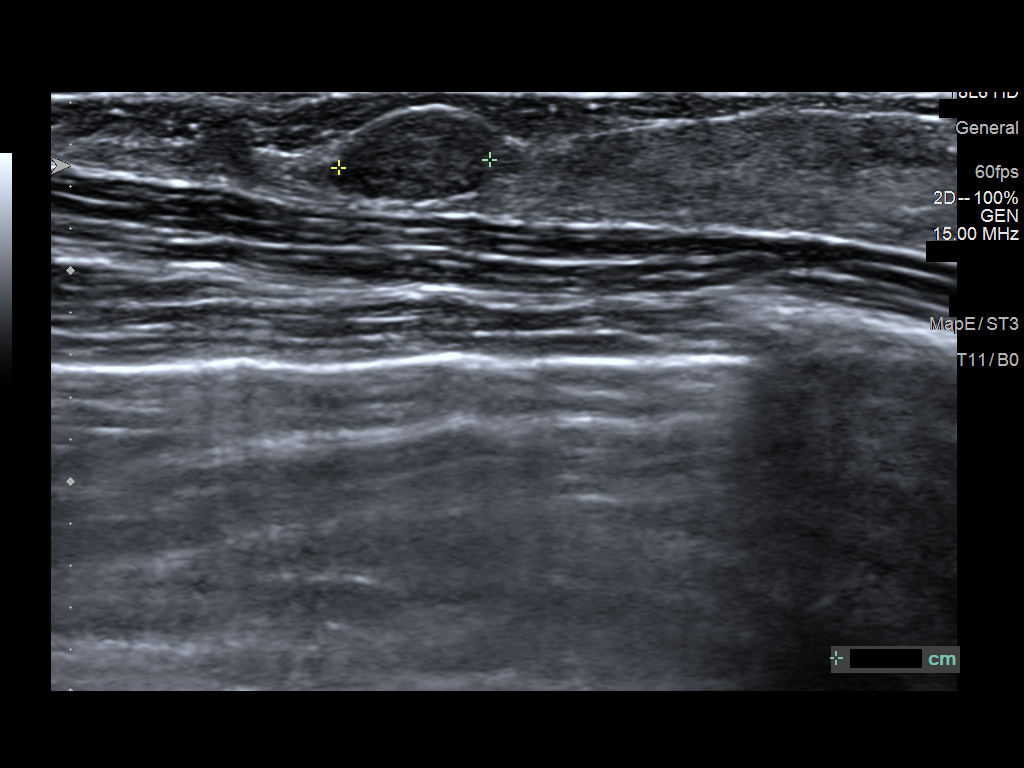
[im 3/4]
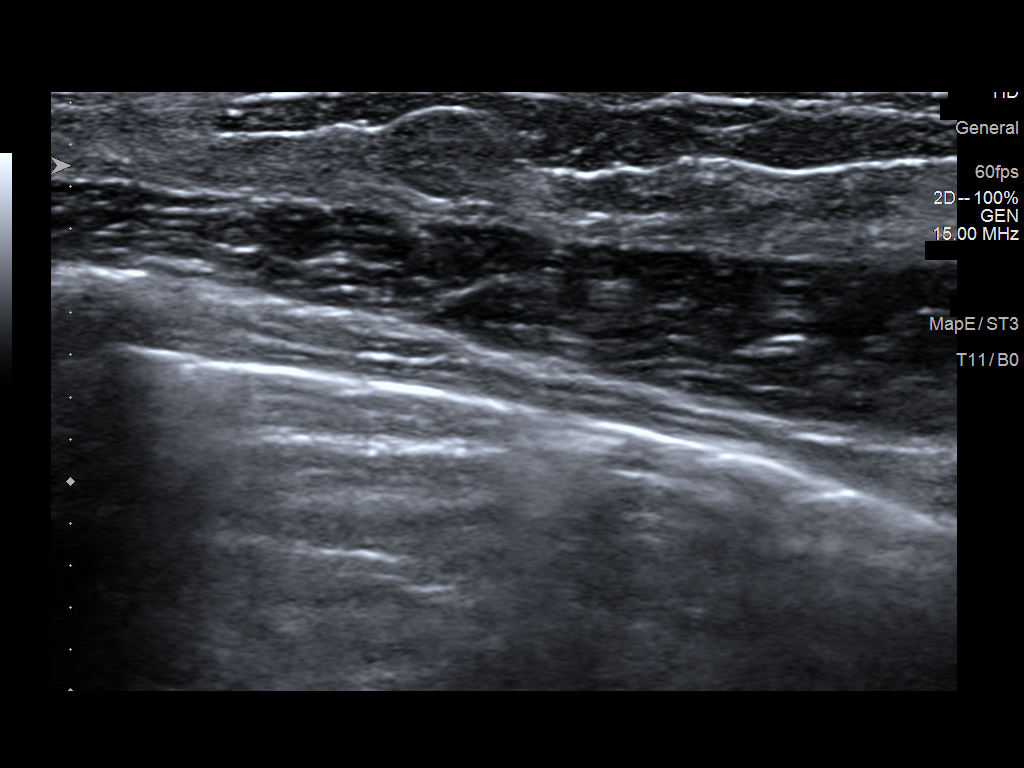
[im 4/4]
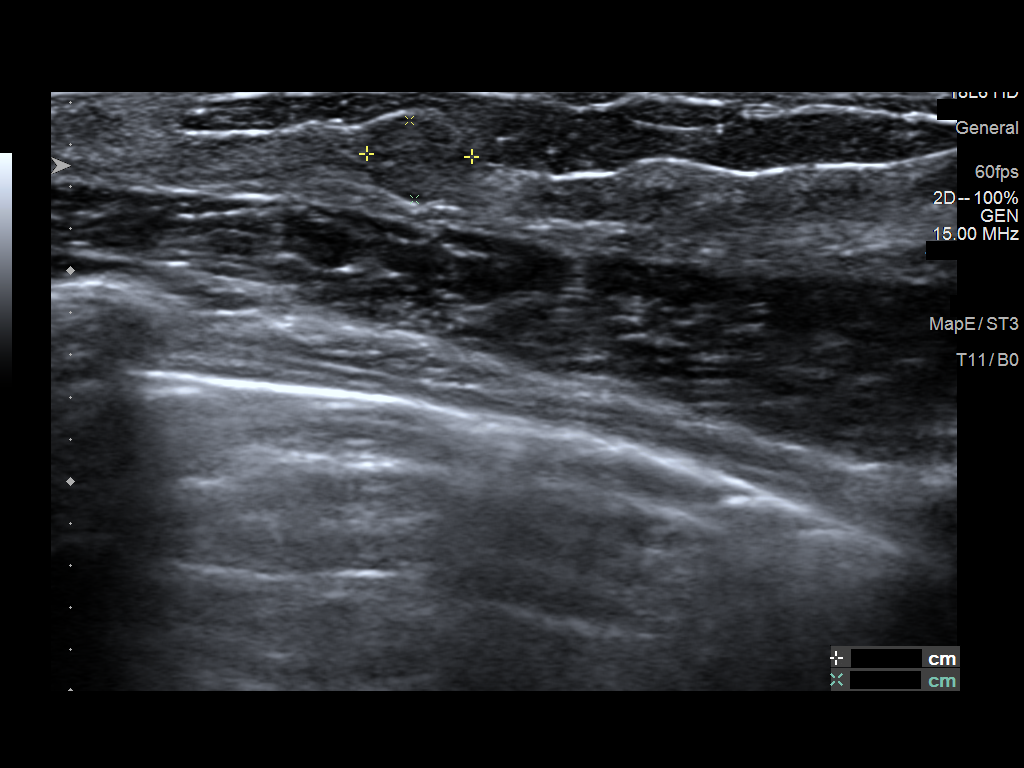

[4 of 4 positions shown; findings below may reference images not displayed]

FINDINGS: Targeted ultrasound is performed, showing a stable 7 x 5 x 4 mm oval
circumscribed hypoechoic mass right breast 10 o'clock position 5 cm
from the nipple.
IMPRESSION: Benign right breast mass given stability over time.

RECOMMENDATION:
Continued clinical evaluation for palpable area in the right breast.

Screening mammogram at age 39 unless there are persistent or
intervening clinical concerns. (Code:[id number])

I have discussed the findings and recommendations with the patient.
If applicable, a reminder letter will be sent to the patient
regarding the next appointment.

BI-RADS CATEGORY  2: Benign.
